# Patient Record
Sex: Male | Born: 1955 | Race: White | Hispanic: No | Marital: Married | Smoking: Never smoker
Health system: Southern US, Community
[De-identification: ages and names within clinical notes are randomized; demographics above are authoritative.]

---

## 2002-05-20 ENCOUNTER — Emergency Department (HOSPITAL_COMMUNITY): Admission: EM | Admit: 2002-05-20 | Discharge: 2002-05-20 | Payer: Self-pay | Admitting: Emergency Medicine

## 2008-05-08 ENCOUNTER — Emergency Department: Payer: Self-pay | Admitting: Emergency Medicine

## 2009-04-17 ENCOUNTER — Ambulatory Visit: Payer: Self-pay | Admitting: Gastroenterology

## 2010-11-25 ENCOUNTER — Ambulatory Visit: Payer: Self-pay | Admitting: Orthopaedic Surgery

## 2016-05-02 ENCOUNTER — Ambulatory Visit: Payer: Self-pay

## 2017-07-04 ENCOUNTER — Emergency Department: Payer: Self-pay | Admitting: Anesthesiology

## 2017-07-04 ENCOUNTER — Ambulatory Visit: Admit: 2017-07-04 | Payer: Self-pay | Admitting: Gastroenterology

## 2017-07-04 ENCOUNTER — Encounter: Payer: Self-pay | Admitting: Emergency Medicine

## 2017-07-04 ENCOUNTER — Emergency Department: Payer: Self-pay

## 2017-07-04 ENCOUNTER — Encounter
Admission: EM | Disposition: A | Discharge status: Home / Self Care | Payer: Self-pay | Source: Home / Self Care | Attending: Emergency Medicine

## 2017-07-04 ENCOUNTER — Emergency Department
Admission: EM | Admit: 2017-07-04 | Discharge: 2017-07-04 | Disposition: A | Discharge status: Home / Self Care | Payer: Self-pay | Attending: Emergency Medicine | Admitting: Emergency Medicine

## 2017-07-04 ENCOUNTER — Other Ambulatory Visit: Payer: Self-pay

## 2017-07-04 DIAGNOSIS — T18108A Unspecified foreign body in esophagus causing other injury, initial encounter: Secondary | ICD-10-CM

## 2017-07-04 DIAGNOSIS — R1310 Dysphagia, unspecified: Secondary | ICD-10-CM | POA: Insufficient documentation

## 2017-07-04 HISTORY — PX: ESOPHAGOGASTRODUODENOSCOPY (EGD) WITH PROPOFOL: SHX5813

## 2017-07-04 LAB — BASIC METABOLIC PANEL
Anion gap: 12 (ref 5–15)
BUN: 20 mg/dL (ref 6–20)
CO2: 23 mmol/L (ref 22–32)
CREATININE: 1.11 mg/dL (ref 0.61–1.24)
Calcium: 9.5 mg/dL (ref 8.9–10.3)
Chloride: 101 mmol/L (ref 98–111)
GFR calc Af Amer: >60 mL/min (ref >60)
Glucose, Bld: 108 mg/dL — ABNORMAL HIGH (ref 70–99)
Potassium: 3.8 mmol/L (ref 3.5–5.1)
SODIUM: 136 mmol/L (ref 135–145)

## 2017-07-04 LAB — CBC
HCT: 48.5 % (ref 40.0–52.0)
Hemoglobin: 16.3 g/dL (ref 13.0–18.0)
MCH: 30.6 pg (ref 26.0–34.0)
MCHC: 33.6 g/dL (ref 32.0–36.0)
MCV: 91.1 fL (ref 80.0–100.0)
PLATELETS: 205 10*3/uL (ref 150–440)
RBC: 5.33 MIL/uL (ref 4.40–5.90)
RDW: 14.2 % (ref 11.5–14.5)
WBC: 6.5 10*3/uL (ref 3.8–10.6)

## 2017-07-04 LAB — TROPONIN I: Troponin I: <0.03 ng/mL (ref <0.03)

## 2017-07-04 SURGERY — ESOPHAGOGASTRODUODENOSCOPY (EGD) WITH PROPOFOL
Anesthesia: General

## 2017-07-04 MED ORDER — ONDANSETRON HCL 4 MG/2ML IJ SOLN
INTRAMUSCULAR | Status: DC | PRN
  Administered 2017-07-04: 4 mg via INTRAVENOUS

## 2017-07-04 MED ORDER — LORAZEPAM 2 MG/ML IJ SOLN
1.0000 mg | Freq: Once | INTRAMUSCULAR | Status: AC
  Administered 2017-07-04: 1 mg via INTRAVENOUS
  Filled 2017-07-04: qty 1

## 2017-07-04 MED ORDER — PROPOFOL 10 MG/ML IV BOLUS
INTRAVENOUS | Status: DC | PRN
  Administered 2017-07-04: 200 mg via INTRAVENOUS

## 2017-07-04 MED ORDER — GI COCKTAIL ~~LOC~~
30.0000 mL | Freq: Once | ORAL | Status: AC
  Administered 2017-07-04: 30 mL via ORAL
  Filled 2017-07-04: qty 30

## 2017-07-04 MED ORDER — LIDOCAINE HCL (PF) 2 % IJ SOLN
INTRAMUSCULAR | Status: AC
  Filled 2017-07-04: qty 10

## 2017-07-04 MED ORDER — SUCCINYLCHOLINE CHLORIDE 20 MG/ML IJ SOLN
INTRAMUSCULAR | Status: DC | PRN
  Administered 2017-07-04: 140 mg via INTRAVENOUS

## 2017-07-04 MED ORDER — FENTANYL CITRATE (PF) 100 MCG/2ML IJ SOLN
INTRAMUSCULAR | Status: AC
  Filled 2017-07-04: qty 2

## 2017-07-04 MED ORDER — ONDANSETRON HCL 4 MG/2ML IJ SOLN
INTRAMUSCULAR | Status: AC
  Filled 2017-07-04: qty 2

## 2017-07-04 MED ORDER — LIDOCAINE HCL (CARDIAC) PF 100 MG/5ML IV SOSY
PREFILLED_SYRINGE | INTRAVENOUS | Status: DC | PRN
  Administered 2017-07-04: 100 mg via INTRAVENOUS

## 2017-07-04 MED ORDER — GLUCAGON HCL RDNA (DIAGNOSTIC) 1 MG IJ SOLR
INTRAMUSCULAR | Status: AC
  Administered 2017-07-04: 1 mg via INTRAVENOUS
  Filled 2017-07-04: qty 1

## 2017-07-04 MED ORDER — PROPOFOL 10 MG/ML IV BOLUS
INTRAVENOUS | Status: AC
  Filled 2017-07-04: qty 20

## 2017-07-04 MED ORDER — SODIUM CHLORIDE 0.9 % IV BOLUS
1000.0000 mL | Freq: Once | INTRAVENOUS | Status: AC
Start: 2017-07-04 — End: 2017-07-04
  Administered 2017-07-04: 1000 mL via INTRAVENOUS

## 2017-07-04 MED ORDER — FENTANYL CITRATE (PF) 100 MCG/2ML IJ SOLN
INTRAMUSCULAR | Status: DC | PRN
  Administered 2017-07-04: 50 ug via INTRAVENOUS

## 2017-07-04 MED ORDER — GLUCAGON HCL RDNA (DIAGNOSTIC) 1 MG IJ SOLR
1.0000 mg | Freq: Once | INTRAMUSCULAR | Status: AC
  Administered 2017-07-04: 1 mg via INTRAVENOUS

## 2017-07-04 MED ORDER — LACTATED RINGERS IV SOLN
INTRAVENOUS | Status: DC | PRN
  Administered 2017-07-04: 20:00:00 via INTRAVENOUS

## 2017-07-04 NOTE — ED Provider Notes (Signed)
[facility] Emergency Department Provider Note  ____________________________________________   I have reviewed the triage vital signs and the nursing notes.   HISTORY  Chief Complaint Gastroesophageal Reflux and Dysphagia   History limited by: Not Limited   HPI Wyatt Gallegos is a 62 y.o. male who presents to the emergency department today because of concern for difficulty with swallowing and acid reflux. The patient states he has a history of acid reflux. Missed his medication this morning. Was eating lunch, which was apparently somewhat spicy chicken, when he felt like he started having issues with his acid reflux. Since then has not been able to swallow his saliva. No significant pain but feels like his discomfort would be relieved if he could vomit. Denies ever having symptoms similar to this.    History reviewed. No pertinent past medical history.  There are no active problems to display for this patient.   History reviewed. No pertinent surgical history.  Prior to Admission medications   Not on File    Allergies Patient has no known allergies.  History reviewed. No pertinent family history.  Social History Social History   Tobacco Use  . Smoking status: Never Smoker  . Smokeless tobacco: Never Used  Substance Use Topics  . Alcohol use: Never    Frequency: Never  . Drug use: Never    Review of Systems Constitutional: No fever/chills Eyes: No visual changes. ENT: No sore throat. Cardiovascular: Denies chest pain. Respiratory: Denies shortness of breath. Gastrointestinal: Positive for difficulty with swallowing.  Genitourinary: Negative for dysuria. Musculoskeletal: Negative for back pain. Skin: Negative for rash. Neurological: Negative for headaches, focal weakness or numbness.  ____________________________________________   PHYSICAL EXAM:  VITAL SIGNS: ED Triage Vitals  Enc Vitals Group     BP 07/04/17 1541 (!) 152/85      Pulse Rate 07/04/17 1541 84     Resp 07/04/17 1541 20     Temp 07/04/17 1550 98 F (36.7 C)     Temp Source 07/04/17 1550 Axillary     SpO2 07/04/17 1541 100 %     Weight 07/04/17 1550 200 lb (90.7 kg)     Height 07/04/17 1550 5\' 9"  (1.753 m)     Head Circumference --      Peak Flow --      Pain Score 07/04/17 1542 8   Constitutional: Alert and oriented. Spitting out saliva. Appears uncomfortable.  Eyes: Conjunctivae are normal.  ENT      Head: Normocephalic and atraumatic.      Nose: No congestion/rhinnorhea.      Mouth/Throat: Mucous membranes are moist.      Neck: No stridor. Hematological/Lymphatic/Immunilogical: No cervical lymphadenopathy. Cardiovascular: Normal rate, regular rhythm.  No murmurs, rubs, or gallops.  Respiratory: Normal respiratory effort without tachypnea nor retractions. Breath sounds are clear and equal bilaterally. No wheezes/rales/rhonchi. Gastrointestinal: Soft and non tender. No rebound. No guarding.  Genitourinary: Deferred Musculoskeletal: Normal range of motion in all extremities. No lower extremity edema. Neurologic:  Normal speech and language. No gross focal neurologic deficits are appreciated.  Skin:  Skin is warm, dry and intact. No rash noted. Psychiatric: Mood and affect are normal. Speech and behavior are normal. Patient exhibits appropriate insight and judgment.  ____________________________________________    LABS (pertinent positives/negatives)  Trop <0.03 CBC wnl BMP wnl except glu 108  ____________________________________________   EKG  I, Phineas SemenGraydon Dantavious Snowball, attending physician, personally viewed and interpreted this EKG  EKG Time: 1525 Rate: 88 Rhythm: normal sinus  rhythm Axis: normal Intervals: qtc 450 QRS: narrow ST changes: no st elevation Impression: normal ekg   ____________________________________________    RADIOLOGY  CXR No acute  disease  ____________________________________________   PROCEDURES  Procedures  ____________________________________________   INITIAL IMPRESSION / ASSESSMENT AND PLAN / ED COURSE  Pertinent labs & imaging results that were available during my care of the patient were reviewed by me and considered in my medical decision making (see chart for details).   Patient presented to the emergency department today because of concerns for difficulty with swallowing and concern for acid reflux.  Exam it does sound more like the patient may have a food impaction.  He certainly not able to swallow or pass any liquids.  Work-up otherwise without any clear etiology.  Chest x-ray and blood work normal.  Did contact Dr. Servando Snare with GI for endoscopy.   ____________________________________________   FINAL CLINICAL IMPRESSION(S) / ED DIAGNOSES  Final diagnoses:  Difficulty swallowing, mixes medication with food     Note: This dictation was prepared with Dragon dictation. Any transcriptional errors that result from this process are unintentional     Phineas Semen, MD 07/04/17 2115

## 2017-07-04 NOTE — Op Note (Signed)
[facility] Gastroenterology Patient Name: Wyatt Gallegos Procedure Date: 07/04/2017 8:18 PM MRN: [id number] Account #: [id number] Date of Birth: May 10, 1955 Admit Type: Outpatient Age: 62 Room: [facility] ENDO ROOM 4 Gender: Male Note Status: Finalized Procedure:            Upper GI endoscopy Indications:          Foreign body in the esophagus Providers:            Midge Minium MD, MD Medicines:            General Anesthesia Complications:        No immediate complications. Procedure:            Pre-Anesthesia Assessment:                       - Prior to the procedure, a History and Physical was                        performed, and patient medications and allergies were                        reviewed. The patient's tolerance of previous                        anesthesia was also reviewed. The risks and benefits of                        the procedure and the sedation options and risks were                        discussed with the patient. All questions were                        answered, and informed consent was obtained. Prior                        Anticoagulants: The patient has taken no previous                        anticoagulant or antiplatelet agents. ASA Grade                        Assessment: II - A patient with mild systemic disease.                        After reviewing the risks and benefits, the patient was                        deemed in satisfactory condition to undergo the                        procedure.                       After obtaining informed consent, the endoscope was                        passed under direct vision. Throughout the procedure,                        the patient's blood  pressure, pulse, and oxygen                        saturations were monitored continuously. The Endoscope                        was introduced through the mouth, and advanced to the                        second part of duodenum. The upper GI endoscopy  was                        accomplished without difficulty. The patient tolerated                        the procedure well. Findings:      Food was found at the gastroesophageal junction. Removal of food was       accomplished.      The stomach was normal.      The examined duodenum was normal. Impression:           - Food at the gastroesophageal junction. Removal was                        successful.                       - Normal stomach.                       - Normal examined duodenum. Recommendation:       - Discharge patient to home.                       - Resume previous diet.                       - Continue present medications.                       - Repeat upper endoscopy in 4 weeks for surveillance. Procedure Code(s):    --- Professional ---                       [phone], Esophagogastroduodenoscopy, flexible, transoral;                        with removal of foreign body(s) Diagnosis Code(s):    --- Professional ---                       T18.108A, Unspecified foreign body in esophagus causing                        other injury, initial encounter CPT copyright 2017 American Medical Association. All rights reserved. The codes documented in this report are preliminary and upon coder review may  be revised to meet current compliance requirements. Midge Miniumarren Destany Severns MD, MD 07/04/2017 8:36:37 PM This report has been signed electronically. Number of Addenda: 0 Note Initiated On: 07/04/2017 8:18 PM      [facility]

## 2017-07-04 NOTE — ED Notes (Signed)
Pt is awake, alert and oriented talks in complete sentences, reports he feels something stuck in his throat, continues to spit, has emesis bag at bedside.

## 2017-07-04 NOTE — ED Triage Notes (Signed)
Pt to ED from home c/o acid reflux, trouble swallowing x3 hours.  States missed last two days of acid reflux medications.  Pt is drooling and spitting in triage intermittently.  Patient with clear lung sounds bilaterally, speaking in complete and coherent sentences, no obvious tonsillar swelling.

## 2017-07-04 NOTE — ED Notes (Signed)
Verbal orders to hold ordered Ativan at this time as patient is calm and is able to swallow at this time. EDP at bedside.

## 2017-07-04 NOTE — ED Notes (Signed)
Pt continues to spit secretions.

## 2017-07-04 NOTE — Anesthesia Post-op Follow-up Note (Signed)
Anesthesia QCDR form completed.        

## 2017-07-04 NOTE — Transfer of Care (Signed)
Immediate Anesthesia Transfer of Care Note  Patient: Wyatt AndaRobert L Gallegos  Procedure(s) Performed: ESOPHAGOGASTRODUODENOSCOPY (EGD) WITH PROPOFOL (N/A )  Patient Location: PACU  Anesthesia Type:General  Level of Consciousness: awake, alert  and oriented  Airway & Oxygen Therapy: Patient Spontanous Breathing and Patient connected to nasal cannula oxygen  Post-op Assessment: Report given to RN and Post -op Vital signs reviewed and stable  Post vital signs: Reviewed and stable  Last Vitals:  Vitals Value Taken Time  BP    Temp    Pulse 88 07/04/2017  8:47 PM  Resp 21 07/04/2017  8:47 PM  SpO2 94 % 07/04/2017  8:47 PM  Vitals shown include unvalidated device data.  Last Pain:  Vitals:   07/04/17 1945  TempSrc: Oral  PainSc: 0-No pain         Complications: No apparent anesthesia complications

## 2017-07-04 NOTE — Anesthesia Procedure Notes (Signed)
Procedure Name: Intubation Date/Time: 07/04/2017 8:29 PM Performed by: Clinton Sawyer, CRNA Pre-anesthesia Checklist: Patient identified, Emergency Drugs available, Patient being monitored, Suction available and Timeout performed Patient Re-evaluated:Patient Re-evaluated prior to induction Oxygen Delivery Method: Circle system utilized Preoxygenation: Pre-oxygenation with 100% oxygen Induction Type: IV induction, Rapid sequence and Cricoid Pressure applied Laryngoscope Size: Mac and 4 Grade View: Grade I Tube type: Oral Tube size: 7.0 mm Number of attempts: 1 Airway Equipment and Method: Stylet Placement Confirmation: ETT inserted through vocal cords under direct vision,  positive ETCO2,  CO2 detector and breath sounds checked- equal and bilateral Secured at: 22 cm Tube secured with: Tape Dental Injury: Teeth and Oropharynx as per pre-operative assessment

## 2017-07-04 NOTE — ED Notes (Signed)
Pt had one episode of emesis, states that he now feels relief and is able to swallow coke. Pt encouraged to continue to swallow secretions.

## 2017-07-04 NOTE — Anesthesia Postprocedure Evaluation (Signed)
Anesthesia Post Note  Patient: Wyatt AndaRobert L Gallegos  Procedure(s) Performed: ESOPHAGOGASTRODUODENOSCOPY (EGD) WITH PROPOFOL (N/A )  Patient location during evaluation: PACU Anesthesia Type: General Level of consciousness: awake and alert and oriented Pain management: pain level controlled Vital Signs Assessment: post-procedure vital signs reviewed and stable Respiratory status: spontaneous breathing Cardiovascular status: blood pressure returned to baseline Anesthetic complications: no     Last Vitals:  Vitals:   07/04/17 1945 07/04/17 2049  BP: (!) 150/95 123/90  Pulse: 65 93  Resp: 20 20  Temp: 36.9 C 36.9 C  SpO2: 98% 96%    Last Pain:  Vitals:   07/04/17 2049  TempSrc:   PainSc: 0-No pain                 Hagen Tidd

## 2017-07-04 NOTE — ED Notes (Signed)
ED Provider at bedside. 

## 2017-07-04 NOTE — ED Notes (Signed)
Pt had an episode of belching and small amount of emesis. States that he feels better following. Pt up to bathroom to urinate.

## 2017-07-04 NOTE — Discharge Instructions (Signed)

## 2017-07-04 NOTE — ED Notes (Signed)
Given report to Glade NurseKimberly, Rn, pt dressed in a hospital gown, belongings in pt's belongings bag

## 2017-07-04 NOTE — H&P (Signed)
Midge Minium, MD [facility] [address] [place], [place] [address] Phone:[phone] Fax : [phone]  Primary Care Physician:  Barbette Reichmann, MD Primary Gastroenterologist:  Dr. Servando Snare  Pre-Procedure History & Physical: HPI:  Wyatt Gallegos is a 62 y.o. male is here for an endoscopy.   History reviewed. No pertinent past medical history.  History reviewed. No pertinent surgical history.  Prior to Admission medications   Medication Sig Start Date End Date Taking? Authorizing Provider  buPROPion (WELLBUTRIN XL) 150 MG 24 hr tablet Take 150 mg by mouth daily. 06/26/17  Yes [provider]  buPROPion (WELLBUTRIN XL) 300 MG 24 hr tablet Take 300 mg by mouth daily. 06/26/17  Yes [provider]  meloxicam (MOBIC) 15 MG tablet Take 15 mg by mouth daily. 06/23/17  Yes [provider]  tamsulosin (FLOMAX) 0.4 MG CAPS capsule Take 0.4 mg by mouth daily. 06/04/17  Yes [provider]  VYVANSE 70 MG capsule Take 70 mg by mouth daily. 06/24/17  Yes [provider]    Allergies as of 07/04/2017  . (No Known Allergies)    History reviewed. No pertinent family history.  Social History   Socioeconomic History  . Marital status: Married    Spouse name: Not on file  . Number of children: Not on file  . Years of education: Not on file  . Highest education level: Not on file  Occupational History  . Not on file  Social Needs  . Financial resource strain: Not on file  . Food insecurity:    Worry: Not on file    Inability: Not on file  . Transportation needs:    Medical: Not on file    Non-medical: Not on file  Tobacco Use  . Smoking status: Never Smoker  . Smokeless tobacco: Never Used  Substance and Sexual Activity  . Alcohol use: Never    Frequency: Never  . Drug use: Never  . Sexual activity: Not on file  Lifestyle  . Physical activity:    Days per week: Not on file    Minutes per session: Not on file  . Stress: Not on  file  Relationships  . Social connections:    Talks on phone: Not on file    Gets together: Not on file    Attends religious service: Not on file    Active member of club or organization: Not on file    Attends meetings of clubs or organizations: Not on file    Relationship status: Not on file  . Intimate partner violence:    Fear of current or ex partner: Not on file    Emotionally abused: Not on file    Physically abused: Not on file    Forced sexual activity: Not on file  Other Topics Concern  . Not on file  Social History Narrative  . Not on file    Review of Systems: See HPI, otherwise negative ROS  Physical Exam: BP (!) 150/95 (BP Location: Left Arm)   Pulse 65   Temp 98.5 F (36.9 C) (Oral)   Resp 20   Ht 5\' 9"  (1.753 m)   Wt 200 lb (90.7 kg)   SpO2 98%   BMI 29.53 kg/m  General:   Alert,  pleasant and cooperative in NAD Head:  Normocephalic and atraumatic. Neck:  Supple; no masses or thyromegaly. Lungs:  Clear throughout to auscultation.    Heart:  Regular rate and rhythm. Abdomen:  Soft, nontender and nondistended. Normal  bowel sounds, without guarding, and without rebound.   Neurologic:  Alert and  oriented x4;  grossly normal neurologically.  Impression/Plan: Wyatt Gallegos is here for an endoscopy to be performed for food in esophagus  Risks, benefits, limitations, and alternatives regarding  endoscopy have been reviewed with the patient.  Questions have been answered.  All parties agreeable.   Midge Miniumarren Jesenya Bowditch, MD  07/04/2017, 8:38 PM

## 2017-07-04 NOTE — ED Notes (Signed)
Pt unable to swallow secretions. States that he feels heaviness in epigastric area.

## 2017-07-04 NOTE — ED Notes (Signed)
Pt to endoscopy.

## 2017-07-04 NOTE — Anesthesia Preprocedure Evaluation (Signed)
Anesthesia Evaluation  Patient identified by MRN, date of birth, ID band Patient awake    Reviewed: Allergy & Precautions, NPO status , Patient's Chart, lab work & pertinent test results  Airway Mallampati: II  TM Distance: >3 FB     Dental no notable dental hx.    Pulmonary neg pulmonary ROS,    Pulmonary exam normal        Cardiovascular negative cardio ROS Normal cardiovascular exam     Neuro/Psych negative neurological ROS  negative psych ROS   GI/Hepatic Neg liver ROS,   Endo/Other  negative endocrine ROS  Renal/GU negative Renal ROS  negative genitourinary   Musculoskeletal negative musculoskeletal ROS (+)   Abdominal Normal abdominal exam  (+)   Peds negative pediatric ROS (+)  Hematology negative hematology ROS (+)   Anesthesia Other Findings   Reproductive/Obstetrics                             Anesthesia Physical Anesthesia Plan  ASA: II and emergent  Anesthesia Plan: General   Post-op Pain Management:    Induction: Intravenous, Rapid sequence and Cricoid pressure planned  PONV Risk Score and Plan:   Airway Management Planned: Oral ETT  Additional Equipment:   Intra-op Plan:   Post-operative Plan: Extubation in OR  Informed Consent: I have reviewed the patients History and Physical, chart, labs and discussed the procedure including the risks, benefits and alternatives for the proposed anesthesia with the patient or authorized representative who has indicated his/her understanding and acceptance.   Dental advisory given  Plan Discussed with: CRNA and Surgeon  Anesthesia Plan Comments:         Anesthesia Quick Evaluation

## 2017-07-07 ENCOUNTER — Encounter: Payer: Self-pay | Admitting: Gastroenterology

## 2017-12-02 ENCOUNTER — Other Ambulatory Visit: Payer: Self-pay | Admitting: Psychiatry

## 2017-12-22 ENCOUNTER — Other Ambulatory Visit: Payer: Self-pay | Admitting: Psychiatry

## 2017-12-22 DIAGNOSIS — F900 Attention-deficit hyperactivity disorder, predominantly inattentive type: Secondary | ICD-10-CM

## 2017-12-22 MED ORDER — LISDEXAMFETAMINE DIMESYLATE 70 MG PO CAPS: 70.0000 mg | ORAL_CAPSULE | Freq: Every day | ORAL | 0 refills | Status: DC

## 2017-12-22 MED ORDER — VYVANSE 70 MG PO CAPS: 70.0000 mg | ORAL_CAPSULE | Freq: Every day | ORAL | 0 refills | Status: DC

## 2017-12-22 NOTE — Progress Notes (Signed)
3 refills vyvanse

## 2018-02-04 ENCOUNTER — Other Ambulatory Visit: Payer: Self-pay | Admitting: Psychiatry

## 2018-02-05 NOTE — Telephone Encounter (Signed)
Need to review paper chart  

## 2018-03-06 ENCOUNTER — Ambulatory Visit: Payer: Self-pay | Admitting: Psychiatry

## 2018-03-06 ENCOUNTER — Encounter: Payer: Self-pay | Admitting: Psychiatry

## 2018-03-06 ENCOUNTER — Ambulatory Visit (INDEPENDENT_AMBULATORY_CARE_PROVIDER_SITE_OTHER): Payer: BLUE CROSS/BLUE SHIELD | Admitting: Psychiatry

## 2018-03-06 VITALS — BP 157/99 | HR 81

## 2018-03-06 DIAGNOSIS — F411 Generalized anxiety disorder: Secondary | ICD-10-CM | POA: Diagnosis not present

## 2018-03-06 DIAGNOSIS — F4001 Agoraphobia with panic disorder: Secondary | ICD-10-CM | POA: Diagnosis not present

## 2018-03-06 DIAGNOSIS — F331 Major depressive disorder, recurrent, moderate: Secondary | ICD-10-CM | POA: Diagnosis not present

## 2018-03-06 DIAGNOSIS — F902 Attention-deficit hyperactivity disorder, combined type: Secondary | ICD-10-CM

## 2018-03-06 DIAGNOSIS — F900 Attention-deficit hyperactivity disorder, predominantly inattentive type: Secondary | ICD-10-CM

## 2018-03-06 MED ORDER — CLONAZEPAM 0.5 MG PO TABS: 0.5000 mg | ORAL_TABLET | Freq: Every day | ORAL | 0 refills | Status: DC

## 2018-03-06 MED ORDER — VYVANSE 70 MG PO CAPS: 70.0000 mg | ORAL_CAPSULE | Freq: Every day | ORAL | 0 refills | Status: DC

## 2018-03-06 MED ORDER — LISDEXAMFETAMINE DIMESYLATE 70 MG PO CAPS: 70.0000 mg | ORAL_CAPSULE | Freq: Every day | ORAL | 0 refills | Status: DC

## 2018-03-06 NOTE — Progress Notes (Signed)
Wyatt Gallegos [id number] 21-Jul-1955 63 y.o.  Subjective:   Patient ID:  Wyatt Gallegos is a 63 y.o. (DOB 06/16/1955) male.  Chief Complaint:  Chief Complaint  Patient presents with  . Follow-up    Medication Management  . Anxiety  . ADHD  last seen September  HPI Wyatt Gallegos presents to the office today for follow-up of depression, anxiety, and ADD HD.  He has been under my care for many years.  Some anxiety and sometimes lightheaded.  Mentioned to PCP 2 weeks ago.  BP up  Too.  Been more anxious for awhile, probably work-related.  Seeing counselor and exercise and helps with divorce care at church.  Feels tense around neck and shoulders.  Spacey and mild nausea.  Tension affects movement.  Very physical. Forgetful.  More fearful feelings.  EMA. With 5 hours sleep since losing B in October.  Was very depressing for a while and then got behind on quarterly goals end of the year.  2 weeks ago PCP stopped the Wellbutrin and started the Lexapro 20.  Not a lot of change so far.    Asks about Klonopin for sleep.  Friend gave him one and it took the edge off.  Past Psychiatric Medication Trials: Prozac, Zoloft, Concerta, Ritalin, Provigil, Buspar, Vyvanse 70, Wellbutrin 450, modafinil, Lexapro.   Review of Systems:  Review of Systems  Constitutional: Positive for fatigue.  Neurological: Negative for tremors and weakness.  Psychiatric/Behavioral: Negative for agitation, behavioral problems, confusion, decreased concentration, dysphoric mood, hallucinations, self-injury, sleep disturbance and suicidal ideas. The patient is nervous/anxious. The patient is not hyperactive.     Medications: I have reviewed the patient's current medications.  Current Outpatient Medications  Medication Sig Dispense Refill  . escitalopram (LEXAPRO) 20 MG tablet Take 20 mg by mouth daily.    Marland Kitchen lisdexamfetamine (VYVANSE) 70 MG capsule Take 1 capsule (70 mg total) by mouth daily. 30 capsule 0  .  lisdexamfetamine (VYVANSE) 70 MG capsule Take 1 capsule (70 mg total) by mouth daily. 30 capsule 0  . meloxicam (MOBIC) 15 MG tablet Take 15 mg by mouth daily.  0  . tadalafil (CIALIS) 5 MG tablet Take by mouth.    . tamsulosin (FLOMAX) 0.4 MG CAPS capsule Take 0.4 mg by mouth daily.  0  . testosterone cypionate (DEPOTESTOSTERONE CYPIONATE) 200 MG/ML injection INJECT INTO THE MUSCLE EVERY 14 DAYS    . VYVANSE 70 MG capsule Take 1 capsule (70 mg total) by mouth daily. 30 capsule 0   No current facility-administered medications for this visit.     Medication Side Effects: Fatigue  Allergies: No Known Allergies  History reviewed. No pertinent past medical history.  History reviewed. No pertinent family history.  Social History   Socioeconomic History  . Marital status: Married    Spouse name: Not on file  . Number of children: Not on file  . Years of education: Not on file  . Highest education level: Not on file  Occupational History  . Not on file  Social Needs  . Financial resource strain: Not on file  . Food insecurity:    Worry: Not on file    Inability: Not on file  . Transportation needs:    Medical: Not on file    Non-medical: Not on file  Tobacco Use  . Smoking status: Never Smoker  . Smokeless tobacco: Never Used  Substance and Sexual Activity  . Alcohol use: Never    Frequency: Never  .  Drug use: Never  . Sexual activity: Not on file  Lifestyle  . Physical activity:    Days per week: Not on file    Minutes per session: Not on file  . Stress: Not on file  Relationships  . Social connections:    Talks on phone: Not on file    Gets together: Not on file    Attends religious service: Not on file    Active member of club or organization: Not on file    Attends meetings of clubs or organizations: Not on file    Relationship status: Not on file  . Intimate partner violence:    Fear of current or ex partner: Not on file    Emotionally abused: Not on file     Physically abused: Not on file    Forced sexual activity: Not on file  Other Topics Concern  . Not on file  Social History Narrative  . Not on file    Past Medical History, Surgical history, Social history, and Family history were reviewed and updated as appropriate.   Please see review of systems for further details on the patient's review from today.   Objective:   Physical Exam:  BP (!) 157/99 (BP Location: Left Arm)   Pulse 81   Physical Exam Constitutional:      General: He is not in acute distress.    Appearance: He is well-developed.  Musculoskeletal:        General: No deformity.  Neurological:     Mental Status: He is alert and oriented to person, place, and time.     Motor: No tremor.     Coordination: Coordination normal.     Gait: Gait normal.  Psychiatric:        Attention and Perception: Attention normal. He is attentive. He does not perceive auditory hallucinations.        Mood and Affect: Mood is anxious. Mood is not depressed. Affect is not labile, blunt, angry or inappropriate.        Speech: Speech normal.        Behavior: Behavior normal. Behavior is not slowed.        Thought Content: Thought content normal. Thought content does not include homicidal or suicidal ideation.        Cognition and Memory: Cognition normal.        Judgment: Judgment normal.     Comments: Insight is fair and always struggled with self awareness and ambivalence. Mildly slowed. Talkative.     Lab Review:     Component Value Date/Time   NA 136 07/04/2017 1551   K 3.8 07/04/2017 1551   CL 101 07/04/2017 1551   CO2 23 07/04/2017 1551   GLUCOSE 108 (H) 07/04/2017 1551   BUN 20 07/04/2017 1551   CREATININE 1.11 07/04/2017 1551   CALCIUM 9.5 07/04/2017 1551   GFRNONAA >60 07/04/2017 1551   GFRAA >60 07/04/2017 1551       Component Value Date/Time   WBC 6.5 07/04/2017 1551   RBC 5.33 07/04/2017 1551   HGB 16.3 07/04/2017 1551   HCT 48.5 07/04/2017 1551   PLT  205 07/04/2017 1551   MCV 91.1 07/04/2017 1551   MCH 30.6 07/04/2017 1551   MCHC 33.6 07/04/2017 1551   RDW 14.2 07/04/2017 1551    No results found for: POCLITH, LITHIUM   No results found for: PHENYTOIN, PHENOBARB, VALPROATE, CBMZ   .res Assessment: Plan:    Generalized anxiety disorder  Panic disorder with  agoraphobia  Major depressive disorder, recurrent episode, moderate (HCC)  Attention deficit hyperactivity disorder (ADHD), combined type   Supportive therapy dealing with work stress.  Positive thinking.  Take advantage of faith resources.  Disc the differences between Wellbutrin and Lexapro and may lose some ADD benefits in the switch.  Anxiety not much better off the Wellbutrin therefore it was not likely causing much of the anxiety so option to add back in if needed for focus.  Cont Lexapro except change to the night to reduce SE fatigue.  May have to reduc the dosage if that doesn't improve.  Give Lexapro 20 more time.  Discussed potential benefits, risks, and side effects of stimulants with patient to include increased heart rate, palpitations, insomnia, increased anxiety, increased irritability, or decreased appetite.  Instructed patient to contact office if experiencing any significant tolerability issues.  We discussed the short-term risks associated with benzodiazepines including sedation and increased fall risk among others.  Discussed long-term side effect risk including dependence, potential withdrawal symptoms, and the potential eventual dose-related risk of dementia.  OK use prn Klonopin 0.5 hs.  Disc risk dependence.  FU 8 weeks.  Meredith Staggers, MD, DFAPA    Please see After Visit Summary for patient specific instructions.  No future appointments.  No orders of the defined types were placed in this encounter.     -------------------------------

## 2018-05-05 ENCOUNTER — Other Ambulatory Visit: Payer: Self-pay

## 2018-05-05 ENCOUNTER — Ambulatory Visit (INDEPENDENT_AMBULATORY_CARE_PROVIDER_SITE_OTHER): Payer: BLUE CROSS/BLUE SHIELD | Admitting: Psychiatry

## 2018-05-05 ENCOUNTER — Encounter: Payer: Self-pay | Admitting: Psychiatry

## 2018-05-05 DIAGNOSIS — F902 Attention-deficit hyperactivity disorder, combined type: Secondary | ICD-10-CM | POA: Diagnosis not present

## 2018-05-05 DIAGNOSIS — F3342 Major depressive disorder, recurrent, in full remission: Secondary | ICD-10-CM

## 2018-05-05 DIAGNOSIS — F900 Attention-deficit hyperactivity disorder, predominantly inattentive type: Secondary | ICD-10-CM

## 2018-05-05 DIAGNOSIS — F411 Generalized anxiety disorder: Secondary | ICD-10-CM

## 2018-05-05 DIAGNOSIS — F4001 Agoraphobia with panic disorder: Secondary | ICD-10-CM | POA: Diagnosis not present

## 2018-05-05 MED ORDER — LISDEXAMFETAMINE DIMESYLATE 70 MG PO CAPS: 70.0000 mg | ORAL_CAPSULE | Freq: Every day | ORAL | 0 refills | Status: DC

## 2018-05-05 MED ORDER — CLONAZEPAM 0.5 MG PO TABS: 0.5000 mg | ORAL_TABLET | Freq: Two times a day (BID) | ORAL | 3 refills | Status: DC

## 2018-05-05 NOTE — Progress Notes (Signed)
Wyatt Gallegos [id number] 11-06-1955 63 y.o.   Virtual Visit via Telephone Note  I connected with pt by telephone and verified that I am speaking with the correct person using two identifiers.   I discussed the limitations, risks, security and privacy concerns of performing an evaluation and management service by telephone and the availability of in person appointments. I also discussed with the patient that there may be a patient responsible charge related to this service. The patient expressed understanding and agreed to proceed.  I discussed the assessment and treatment plan with the patient. The patient was provided an opportunity to ask questions and all were answered. The patient agreed with the plan and demonstrated an understanding of the instructions.   The patient was advised to call back or seek an in-person evaluation if the symptoms worsen or if the condition fails to improve as anticipated.  I provided 15 minutes of non-face-to-face time during this encounter. The call started at noon and ended at 1215. The patient was located at work and the provider was located at office.   Subjective:   Patient ID:  Wyatt Gallegos is a 63 y.o. (DOB 07-Jun-1955) male.  Chief Complaint:  Chief Complaint  Patient presents with  . Follow-up    med management  . Anxiety  last seen September  Anxiety  Symptoms include nervous/anxious behavior. Patient reports no confusion, decreased concentration or suicidal ideas.     Wyatt Gallegos presents to the office today for follow-up of depression, anxiety, and ADD HD.  He has been under my care for many years.  Just got laid off with Covid.  Daily still had had anxiety but the Lexapro causes some fatigue.  Tension daily workdays.  Still dealing with it.  Use to feel calmer.  Not depressed.  Not panic.  Still doing counseling.  Exercising 3 times weekly.  Klonazepam really helped take the edge off anxiety during the day and could focus  better bc less anxiety.  Wants to take it twice daily.  Sleep well with Klonopin.  2 weeks ago PCP stopped the Wellbutrin and started the Lexapro 20.  Not a lot of change so far.    Asks about Klonopin for sleep.  Friend gave him one and it took the edge off.  Past Psychiatric Medication Trials: Prozac, Zoloft,Lexapro. Concerta, Ritalin, Provigil, Buspar, Vyvanse 70, Wellbutrin 450, modafinil,    Review of Systems:  Review of Systems  Constitutional: Positive for fatigue.  Neurological: Negative for tremors and weakness.  Psychiatric/Behavioral: Negative for agitation, behavioral problems, confusion, decreased concentration, dysphoric mood, hallucinations, self-injury, sleep disturbance and suicidal ideas. The patient is nervous/anxious. The patient is not hyperactive.     Medications: I have reviewed the patient's current medications.  Current Outpatient Medications  Medication Sig Dispense Refill  . clonazePAM (KLONOPIN) 0.5 MG tablet Take 1 tablet (0.5 mg total) by mouth at bedtime. 30 tablet 0  . escitalopram (LEXAPRO) 20 MG tablet Take 20 mg by mouth daily.    Marland Kitchen lisdexamfetamine (VYVANSE) 70 MG capsule Take 1 capsule (70 mg total) by mouth daily. 30 capsule 0  . lisdexamfetamine (VYVANSE) 70 MG capsule Take 1 capsule (70 mg total) by mouth daily. 30 capsule 0  . lisdexamfetamine (VYVANSE) 70 MG capsule Take 1 capsule (70 mg total) by mouth daily. 30 capsule 0  . meloxicam (MOBIC) 15 MG tablet Take 15 mg by mouth daily.  0  . tadalafil (CIALIS) 5 MG tablet Take by mouth.    Marland Kitchen  tamsulosin (FLOMAX) 0.4 MG CAPS capsule Take 0.4 mg by mouth daily.  0  . testosterone cypionate (DEPOTESTOSTERONE CYPIONATE) 200 MG/ML injection INJECT 1ML INTO THE MUSCLE EVERY 14 DAYS    . VYVANSE 70 MG capsule Take 1 capsule (70 mg total) by mouth daily. 30 capsule 0   No current facility-administered medications for this visit.     Medication Side Effects: Fatigue  Allergies: No Known  Allergies  No past medical history on file.  No family history on file.  Social History   Socioeconomic History  . Marital status: Married    Spouse name: Not on file  . Number of children: Not on file  . Years of education: Not on file  . Highest education level: Not on file  Occupational History  . Not on file  Social Needs  . Financial resource strain: Not on file  . Food insecurity:    Worry: Not on file    Inability: Not on file  . Transportation needs:    Medical: Not on file    Non-medical: Not on file  Tobacco Use  . Smoking status: Never Smoker  . Smokeless tobacco: Never Used  Substance and Sexual Activity  . Alcohol use: Never    Frequency: Never  . Drug use: Never  . Sexual activity: Not on file  Lifestyle  . Physical activity:    Days per week: Not on file    Minutes per session: Not on file  . Stress: Not on file  Relationships  . Social connections:    Talks on phone: Not on file    Gets together: Not on file    Attends religious service: Not on file    Active member of club or organization: Not on file    Attends meetings of clubs or organizations: Not on file    Relationship status: Not on file  . Intimate partner violence:    Fear of current or ex partner: Not on file    Emotionally abused: Not on file    Physically abused: Not on file    Forced sexual activity: Not on file  Other Topics Concern  . Not on file  Social History Narrative  . Not on file    Past Medical History, Surgical history, Social history, and Family history were reviewed and updated as appropriate.   Please see review of systems for further details on the patient's review from today.   Objective:   Physical Exam:  There were no vitals taken for this visit.  Physical Exam Neurological:     Mental Status: He is alert and oriented to person, place, and time.     Cranial Nerves: No dysarthria.  Psychiatric:        Attention and Perception: Attention normal.         Mood and Affect: Mood is anxious. Mood is not depressed.        Speech: Speech normal.        Behavior: Behavior is cooperative.        Thought Content: Thought content normal. Thought content is not paranoid or delusional. Thought content does not include homicidal or suicidal ideation. Thought content does not include homicidal or suicidal plan.        Cognition and Memory: Cognition and memory normal.        Judgment: Judgment normal.     Comments: Insight good.     Lab Review:     Component Value Date/Time   NA 136 07/04/2017  1551   K 3.8 07/04/2017 1551   CL 101 07/04/2017 1551   CO2 23 07/04/2017 1551   GLUCOSE 108 (H) 07/04/2017 1551   BUN 20 07/04/2017 1551   CREATININE 1.11 07/04/2017 1551   CALCIUM 9.5 07/04/2017 1551   GFRNONAA >60 07/04/2017 1551   GFRAA >60 07/04/2017 1551       Component Value Date/Time   WBC 6.5 07/04/2017 1551   RBC 5.33 07/04/2017 1551   HGB 16.3 07/04/2017 1551   HCT 48.5 07/04/2017 1551   PLT 205 07/04/2017 1551   MCV 91.1 07/04/2017 1551   MCH 30.6 07/04/2017 1551   MCHC 33.6 07/04/2017 1551   RDW 14.2 07/04/2017 1551    No results found for: POCLITH, LITHIUM   No results found for: PHENYTOIN, PHENOBARB, VALPROATE, CBMZ   .res Assessment: Plan:    Generalized anxiety disorder  Panic disorder with agoraphobia  Attention deficit hyperactivity disorder (ADHD), combined type  Recurrent major depression in full remission (HCC)   Supportive therapy dealing with work stress.  Positive thinking.  Take advantage of faith resources.   Anxiety not much better off the Wellbutrin therefore it was not likely causing much of the anxiety so option to add back in if needed for focus.  Still anxious but not depressed.  Too much fatigue with Lexapro.  His anxiety has been difficult to control with standard SSRIs.  He asked to try clonazepam twice a day.  It is not ideal to use benzodiazepines plus stimulants together but given his treatment  resistant anxiety and his ability to get by usually with 0.25 mg Klonopin in the morning it is a reasonable accommodation.  We will not want to see dose escalation with the Klonopin.  Cont Lexapro except change to the night to reduce SE fatigue.  May have to reduc the dosage if that doesn't improve.  Reduce Lexapro 10 to eliminate fatigue.  If anxiety picks up then try duloxetine.  Which should have less fatigue than the Lexapro.  Discussed potential benefits, risks, and side effects of stimulants with patient to include increased heart rate, palpitations, insomnia, increased anxiety, increased irritability, or decreased appetite.  Instructed patient to contact office if experiencing any significant tolerability issues.  We discussed the short-term risks associated with benzodiazepines including sedation and increased fall risk among others.  Discussed long-term side effect risk including dependence, potential withdrawal symptoms, and the potential eventual dose-related risk of dementia.  OK use prn Klonopin 0.5 hs and ok 1/2 to 1 tablet in the morning.  Disc risk dependence.  FU 2 mos  Meredith Staggers, MD, DFAPA    Please see After Visit Summary for patient specific instructions.  No future appointments.  No orders of the defined types were placed in this encounter.     -------------------------------

## 2018-05-20 ENCOUNTER — Other Ambulatory Visit: Payer: Self-pay | Admitting: Psychiatry

## 2018-05-20 ENCOUNTER — Telehealth: Payer: Self-pay | Admitting: Psychiatry

## 2018-05-20 MED ORDER — DULOXETINE HCL 20 MG PO CPEP: ORAL_CAPSULE | ORAL | 1 refills | Status: DC

## 2018-05-20 NOTE — Telephone Encounter (Signed)
He has clonazepam refills at the pharmacy.  As discussed at the last visit if the reduction in Lexapro did not help with his sleepiness and grogginess we would switch him to duloxetine.  I sent in prescription for duloxetine 20 mg capsules 1 daily for 1 week and then 2 daily.  He should immediately stop the Lexapro as soon as he starts duloxetine.

## 2018-05-20 NOTE — Telephone Encounter (Signed)
Pt called to advise the new med Lexapro makes him very sleepy and groggy. He only takes 1/2 at a time. Was taking Wellbutrin And switched to Lexapro. Clonazepam works well for anxiety. Only takes 1/2 and it works well. [phone]    [facility] [employer]

## 2018-05-21 NOTE — Telephone Encounter (Signed)
Left detailed information and to call back with any questions or concerns 

## 2018-06-30 ENCOUNTER — Encounter: Payer: Self-pay | Admitting: Psychiatry

## 2018-06-30 ENCOUNTER — Ambulatory Visit (INDEPENDENT_AMBULATORY_CARE_PROVIDER_SITE_OTHER): Payer: BC Managed Care – PPO | Admitting: Psychiatry

## 2018-06-30 ENCOUNTER — Other Ambulatory Visit: Payer: Self-pay

## 2018-06-30 DIAGNOSIS — F411 Generalized anxiety disorder: Secondary | ICD-10-CM | POA: Diagnosis not present

## 2018-06-30 DIAGNOSIS — F5105 Insomnia due to other mental disorder: Secondary | ICD-10-CM

## 2018-06-30 DIAGNOSIS — F3341 Major depressive disorder, recurrent, in partial remission: Secondary | ICD-10-CM | POA: Diagnosis not present

## 2018-06-30 DIAGNOSIS — F4001 Agoraphobia with panic disorder: Secondary | ICD-10-CM

## 2018-06-30 DIAGNOSIS — F900 Attention-deficit hyperactivity disorder, predominantly inattentive type: Secondary | ICD-10-CM

## 2018-06-30 MED ORDER — LISDEXAMFETAMINE DIMESYLATE 70 MG PO CAPS: 70.0000 mg | ORAL_CAPSULE | Freq: Every day | ORAL | 0 refills | Status: DC

## 2018-06-30 MED ORDER — DULOXETINE HCL 20 MG PO CPEP: ORAL_CAPSULE | ORAL | 0 refills | Status: DC

## 2018-06-30 MED ORDER — CLONAZEPAM 0.5 MG PO TABS: 0.5000 mg | ORAL_TABLET | Freq: Two times a day (BID) | ORAL | 0 refills | Status: DC

## 2018-06-30 MED ORDER — VYVANSE 70 MG PO CAPS: 70.0000 mg | ORAL_CAPSULE | Freq: Every day | ORAL | 0 refills | Status: DC

## 2018-06-30 NOTE — Progress Notes (Signed)
Wyatt Gallegos [id number] 03-31-1955 63 y.o.   Virtual Visit via Telephone Note  I connected with pt by telephone and verified that I am speaking with the correct person using two identifiers.   I discussed the limitations, risks, security and privacy concerns of performing an evaluation and management service by telephone and the availability of in person appointments. I also discussed with the patient that there may be a patient responsible charge related to this service. The patient expressed understanding and agreed to proceed.  I discussed the assessment and treatment plan with the patient. The patient was provided an opportunity to ask questions and all were answered. The patient agreed with the plan and demonstrated an understanding of the instructions.   The patient was advised to call back or seek an in-person evaluation if the symptoms worsen or if the condition fails to improve as anticipated.  I provided 36minutes of non-face-to-face time during this encounter. The call started at 10 AM and ended at 1030. The patient was located at work and the provider was located at office.   Subjective:   Patient ID:  Wyatt Gallegos is a 63 y.o. (DOB 19-Sep-1955) male.  Chief Complaint:  Chief Complaint  Patient presents with  . Medication Problem    Lexapro fatigue, med management  . Anxiety    sometimes with exercise  . Follow-up    change to duloxetine  last seen September  Anxiety Symptoms include nervous/anxious behavior. Patient reports no confusion, decreased concentration or suicidal ideas.     Georgiann Mccoy presents to the office today for follow-up of depression, anxiety, and ADD HD.  He has been under my care for many years.  Last visit May and was fatigue and we reduced Lexapro to help.  It did not help so we switched to duloxetine and it helped when he called back May 13.  Much more balanced out now and feels better.  Energy is better and no longer drowsy as with  Lexapro.  More alert and mindful.  Doing well physically.  Playing a lot of tennis.  Feels stronger.  Losing weight.Anxiety is better overall.  Using clonazepam usually about 8:30.  Sleeping well.  Just got laid off with Covid.  New job and feels better.  Still doing counseling.  Exercising 3 times weekly.  Klonazepam really helped take the edge off anxiety during the day and could focus better bc less anxiety.  Wants to take it twice daily.  Sleep well with Klonopin.  2 weeks ago PCP stopped the Wellbutrin and started the Lexapro 20.  Not a lot of change so far.    Clonazepam has helped anxiety and sleep.  He is functioning better as a result.  Still going to counseling.  Denies her alcohol or caffeine excess.  Past Psychiatric Medication Trials: Prozac, Zoloft,Lexapro fatigue. Concerta, Ritalin, Provigil, Buspar, Vyvanse 70, Wellbutrin 450, modafinil, Duloxetine    Review of Systems:  Review of Systems  Constitutional: Negative for fatigue.  Neurological: Negative for tremors and weakness.  Psychiatric/Behavioral: Negative for agitation, behavioral problems, confusion, decreased concentration, dysphoric mood, hallucinations, self-injury, sleep disturbance and suicidal ideas. The patient is nervous/anxious. The patient is not hyperactive.     Medications: I have reviewed the patient's current medications.  Current Outpatient Medications  Medication Sig Dispense Refill  . clonazePAM (KLONOPIN) 0.5 MG tablet Take 1 tablet (0.5 mg total) by mouth 2 (two) times daily. 180 tablet 0  . DULoxetine (CYMBALTA) 20 MG capsule 2 daily 180  capsule 0  . lisdexamfetamine (VYVANSE) 70 MG capsule Take 1 capsule (70 mg total) by mouth daily. 30 capsule 0  . lisdexamfetamine (VYVANSE) 70 MG capsule Take 1 capsule (70 mg total) by mouth daily. 30 capsule 0  . [START ON 07/28/2018] lisdexamfetamine (VYVANSE) 70 MG capsule Take 1 capsule (70 mg total) by mouth daily. 30 capsule 0  . meloxicam (MOBIC)  15 MG tablet Take 15 mg by mouth daily.  0  . tadalafil (CIALIS) 5 MG tablet Take by mouth.    . tamsulosin (FLOMAX) 0.4 MG CAPS capsule Take 0.4 mg by mouth daily.  0  . testosterone cypionate (DEPOTESTOSTERONE CYPIONATE) 200 MG/ML injection INJECT 1ML INTO THE MUSCLE EVERY 14 DAYS    . metoprolol tartrate (LOPRESSOR) 25 MG tablet Take 25 mg by mouth daily.    Wyatt Gallegos. [START ON 08/25/2018] VYVANSE 70 MG capsule Take 1 capsule (70 mg total) by mouth daily. 30 capsule 0   No current facility-administered medications for this visit.     Medication Side Effects: Fatigue  Allergies: No Known Allergies  History reviewed. No pertinent past medical history.  History reviewed. No pertinent family history.  Social History   Socioeconomic History  . Marital status: Married    Spouse name: Not on file  . Number of children: Not on file  . Years of education: Not on file  . Highest education level: Not on file  Occupational History  . Not on file  Social Needs  . Financial resource strain: Not on file  . Food insecurity    Worry: Not on file    Inability: Not on file  . Transportation needs    Medical: Not on file    Non-medical: Not on file  Tobacco Use  . Smoking status: Never Smoker  . Smokeless tobacco: Never Used  Substance and Sexual Activity  . Alcohol use: Never    Frequency: Never  . Drug use: Never  . Sexual activity: Not on file  Lifestyle  . Physical activity    Days per week: Not on file    Minutes per session: Not on file  . Stress: Not on file  Relationships  . Social Musicianconnections    Talks on phone: Not on file    Gets together: Not on file    Attends religious service: Not on file    Active member of club or organization: Not on file    Attends meetings of clubs or organizations: Not on file    Relationship status: Not on file  . Intimate partner violence    Fear of current or ex partner: Not on file    Emotionally abused: Not on file    Physically abused: Not  on file    Forced sexual activity: Not on file  Other Topics Concern  . Not on file  Social History Narrative  . Not on file    Past Medical History, Surgical history, Social history, and Family history were reviewed and updated as appropriate.   Please see review of systems for further details on the patient's review from today.   Objective:   Physical Exam:  There were no vitals taken for this visit.  Physical Exam Neurological:     Mental Status: He is alert and oriented to person, place, and time.     Cranial Nerves: No dysarthria.  Psychiatric:        Attention and Perception: He is inattentive. He does not perceive auditory or visual hallucinations.  Mood and Affect: Mood is not anxious or depressed.        Speech: Speech normal.        Behavior: Behavior is cooperative.        Thought Content: Thought content normal. Thought content is not paranoid or delusional. Thought content does not include homicidal or suicidal ideation. Thought content does not include homicidal or suicidal plan.        Cognition and Memory: Cognition and memory normal.        Judgment: Judgment normal.     Comments: Insight good. A little forgetful about med compliance.     Lab Review:     Component Value Date/Time   NA 136 07/04/2017 1551   K 3.8 07/04/2017 1551   CL 101 07/04/2017 1551   CO2 23 07/04/2017 1551   GLUCOSE 108 (H) 07/04/2017 1551   BUN 20 07/04/2017 1551   CREATININE 1.11 07/04/2017 1551   CALCIUM 9.5 07/04/2017 1551   GFRNONAA >60 07/04/2017 1551   GFRAA >60 07/04/2017 1551       Component Value Date/Time   WBC 6.5 07/04/2017 1551   RBC 5.33 07/04/2017 1551   HGB 16.3 07/04/2017 1551   HCT 48.5 07/04/2017 1551   PLT 205 07/04/2017 1551   MCV 91.1 07/04/2017 1551   MCH 30.6 07/04/2017 1551   MCHC 33.6 07/04/2017 1551   RDW 14.2 07/04/2017 1551    No results found for: POCLITH, LITHIUM   No results found for: PHENYTOIN, PHENOBARB, VALPROATE, CBMZ    .res Assessment: Plan:   Molly MaduroRobert was seen today for medication problem, anxiety and follow-up.  Diagnoses and all orders for this visit:  Depression, major, recurrent, in partial remission (HCC) -     DULoxetine (CYMBALTA) 20 MG capsule; 2 daily  Generalized anxiety disorder -     DULoxetine (CYMBALTA) 20 MG capsule; 2 daily  Panic disorder with agoraphobia -     DULoxetine (CYMBALTA) 20 MG capsule; 2 daily -     clonazePAM (KLONOPIN) 0.5 MG tablet; Take 1 tablet (0.5 mg total) by mouth 2 (two) times daily.  Insomnia due to mental condition  Attention deficit hyperactivity disorder (ADHD), predominantly inattentive type -     lisdexamfetamine (VYVANSE) 70 MG capsule; Take 1 capsule (70 mg total) by mouth daily. -     lisdexamfetamine (VYVANSE) 70 MG capsule; Take 1 capsule (70 mg total) by mouth daily. -     VYVANSE 70 MG capsule; Take 1 capsule (70 mg total) by mouth daily.     Supportive therapy dealing with work stress.  Positive thinking.  Take advantage of faith resources.  Better off Wellbutrin and Lexapro was causing too much fatigue.  That is better off that medicine and on duloxetine.  So far his mood and anxiety are better as well.  He did not follow the instructions about increasing duloxetine as quickly as was recommended therefore he is at risk of some relapse in symptoms over the next 2 to 3 weeks.  He was cautioned about that and it should resolve on its own although we may need to increase duloxetine on the way to 60 mg  Less anxiety and fatigue so far with duloxetine. But only been on 40 mg for a week.  Disc risk worsening before he gets better. Consider increase duloxetine.  Call if this happens.  Discussed potential benefits, risks, and side effects of stimulants with patient to include increased heart rate, palpitations, insomnia, increased anxiety, increased irritability, or decreased  appetite.  Instructed patient to contact office if experiencing any  significant tolerability issues.  We discussed the short-term risks associated with benzodiazepines including sedation and increased fall risk among others.  Discussed long-term side effect risk including dependence, potential withdrawal symptoms, and the potential eventual dose-related risk of dementia.  OK use prn Klonopin 0.5 hs and ok 1/2 to 1 tablet in the morning.  Disc risk dependence.  FU3 mos  Meredith Staggersarey Cottle, MD, DFAPA    Please see After Visit Summary for patient specific instructions.  No future appointments.  No orders of the defined types were placed in this encounter.     -------------------------------

## 2018-09-27 ENCOUNTER — Other Ambulatory Visit: Payer: Self-pay | Admitting: Psychiatry

## 2018-09-27 DIAGNOSIS — F4001 Agoraphobia with panic disorder: Secondary | ICD-10-CM

## 2018-09-27 DIAGNOSIS — F411 Generalized anxiety disorder: Secondary | ICD-10-CM

## 2018-09-27 DIAGNOSIS — F3341 Major depressive disorder, recurrent, in partial remission: Secondary | ICD-10-CM

## 2018-10-20 ENCOUNTER — Encounter: Payer: Self-pay | Admitting: Psychiatry

## 2018-10-20 ENCOUNTER — Other Ambulatory Visit: Payer: Self-pay

## 2018-10-20 ENCOUNTER — Ambulatory Visit (INDEPENDENT_AMBULATORY_CARE_PROVIDER_SITE_OTHER): Payer: BC Managed Care – PPO | Admitting: Psychiatry

## 2018-10-20 DIAGNOSIS — F900 Attention-deficit hyperactivity disorder, predominantly inattentive type: Secondary | ICD-10-CM

## 2018-10-20 DIAGNOSIS — F411 Generalized anxiety disorder: Secondary | ICD-10-CM

## 2018-10-20 DIAGNOSIS — F4001 Agoraphobia with panic disorder: Secondary | ICD-10-CM

## 2018-10-20 DIAGNOSIS — F5105 Insomnia due to other mental disorder: Secondary | ICD-10-CM

## 2018-10-20 DIAGNOSIS — F3341 Major depressive disorder, recurrent, in partial remission: Secondary | ICD-10-CM

## 2018-10-20 MED ORDER — CLONAZEPAM 0.5 MG PO TABS: ORAL_TABLET | ORAL | 0 refills | Status: DC

## 2018-10-20 MED ORDER — DULOXETINE HCL 20 MG PO CPEP: 40.0000 mg | ORAL_CAPSULE | Freq: Every day | ORAL | 0 refills | Status: DC

## 2018-10-20 MED ORDER — LISDEXAMFETAMINE DIMESYLATE 70 MG PO CAPS: 70.0000 mg | ORAL_CAPSULE | Freq: Every day | ORAL | 0 refills | Status: DC

## 2018-10-20 NOTE — Progress Notes (Signed)
Wyatt AndaRobert L Castanon [id number] May 04, 1955 63 y.o.   Virtual Visit via Telephone Note  I connected with pt by telephone and verified that I am speaking with the correct person using two identifiers.   I discussed the limitations, risks, security and privacy concerns of performing an evaluation and management service by telephone and the availability of in person appointments. I also discussed with the patient that there may be a patient responsible charge related to this service. The patient expressed understanding and agreed to proceed.  I discussed the assessment and treatment plan with the patient. The patient was provided an opportunity to ask questions and all were answered. The patient agreed with the plan and demonstrated an understanding of the instructions.   The patient was advised to call back or seek an in-person evaluation if the symptoms worsen or if the condition fails to improve as anticipated.  I provided 30minutes of non-face-to-face time during this encounter. The call started at 9 AM and ended at 930. The patient was located at work and the provider was located at office.   Subjective:   Patient ID:  Wyatt Gallegos is a 63 y.o. (DOB May 04, 1955) male.  Chief Complaint:  Chief Complaint  Patient presents with  . Follow-up    Medication Management  . Anxiety    Medication Management  . Depression    Medication Management  last seen September  Anxiety Symptoms include nervous/anxious behavior. Patient reports no confusion, decreased concentration or suicidal ideas.     Wyatt Andaobert L Demps presents to the office today for follow-up of depression, anxiety, and ADD HD.  He has been under my care for many years.  At visit May and was fatigue and we reduced Lexapro to help.  It did not help so we switched to duloxetine  40 and it helped.  He has some chronic anxiety and was taking clonazepam at night and half of a clonazepam tablet in the morning for sleep and anxiety  respectively  Still pleased with duloxetine and feels like his normal self.  Patient reports stable mood and denies depressed or irritable moods.  Patient denies any recent difficulty with anxiety.  Patient denies difficulty with sleep initiation or maintenance. Denies appetite disturbance.  Patient reports that energy and motivation have been good.  Patient denies any difficulty with concentration.  Patient denies any suicidal ideation.  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    Still doing counseling.  Exercising 3 times weekly.  Klonazepam really helped take the edge off anxiety during the day and could focus better bc less anxiety.  Taking 0.5 mg AM and 1.0 mg HS.  Keeps anxiety from paralyzing him.  Getting sleep.  Wants to take it twice daily.  Sleep well with Klonopin.  Still benefit with Vyvanse for focus.  Still going to counseling.  Denies her alcohol or caffeine excess.  Past Psychiatric Medication Trials: Prozac, Zoloft,Lexapro fatigue. Concerta, Ritalin, Provigil, Buspar, Vyvanse 70, Wellbutrin 450, modafinil, Duloxetine 40,    Review of Systems:  Review of Systems  Constitutional: Negative for fatigue.  Neurological: Negative for tremors and weakness.  Psychiatric/Behavioral: Negative for agitation, behavioral problems, confusion, decreased concentration, dysphoric mood, hallucinations, self-injury, sleep disturbance and suicidal ideas. The patient is nervous/anxious. The patient is not hyperactive.     Medications: I have reviewed the patient's current medications.  Current Outpatient Medications  Medication Sig Dispense Refill  . clonazePAM (KLONOPIN) 0.5 MG tablet Take 1 tablet (0.5 mg total) by mouth  2 (two) times daily. 180 tablet 0  . DULoxetine (CYMBALTA) 20 MG capsule TAKE TWO CAPSULES BY MOUTH DAILY 180 capsule 0  . lisdexamfetamine (VYVANSE) 70 MG capsule Take 1 capsule (70 mg total) by mouth daily. 30 capsule 0  . lisdexamfetamine  (VYVANSE) 70 MG capsule Take 1 capsule (70 mg total) by mouth daily. 30 capsule 0  . lisdexamfetamine (VYVANSE) 70 MG capsule Take 1 capsule (70 mg total) by mouth daily. 30 capsule 0  . meloxicam (MOBIC) 15 MG tablet Take 15 mg by mouth daily.  0  . metoprolol tartrate (LOPRESSOR) 25 MG tablet Take 25 mg by mouth daily.    . tadalafil (CIALIS) 5 MG tablet Take by mouth.    . tamsulosin (FLOMAX) 0.4 MG CAPS capsule Take 0.4 mg by mouth daily.  0  . testosterone cypionate (DEPOTESTOSTERONE CYPIONATE) 200 MG/ML injection INJECT INTO THE MUSCLE EVERY 14 DAYS    . VYVANSE 70 MG capsule Take 1 capsule (70 mg total) by mouth daily. 30 capsule 0   No current facility-administered medications for this visit.     Medication Side Effects: Fatigue  Allergies: No Known Allergies  History reviewed. No pertinent past medical history.  History reviewed. No pertinent family history.  Social History   Socioeconomic History  . Marital status: Married    Spouse name: Not on file  . Number of children: Not on file  . Years of education: Not on file  . Highest education level: Not on file  Occupational History  . Not on file  Social Needs  . Financial resource strain: Not on file  . Food insecurity    Worry: Not on file    Inability: Not on file  . Transportation needs    Medical: Not on file    Non-medical: Not on file  Tobacco Use  . Smoking status: Never Smoker  . Smokeless tobacco: Never Used  Substance and Sexual Activity  . Alcohol use: Never    Frequency: Never  . Drug use: Never  . Sexual activity: Not on file  Lifestyle  . Physical activity    Days per week: Not on file    Minutes per session: Not on file  . Stress: Not on file  Relationships  . Social Musician on phone: Not on file    Gets together: Not on file    Attends religious service: Not on file    Active member of club or organization: Not on file    Attends meetings of clubs or organizations: Not  on file    Relationship status: Not on file  . Intimate partner violence    Fear of current or ex partner: Not on file    Emotionally abused: Not on file    Physically abused: Not on file    Forced sexual activity: Not on file  Other Topics Concern  . Not on file  Social History Narrative  . Not on file    Past Medical History, Surgical history, Social history, and Family history were reviewed and updated as appropriate.   Please see review of systems for further details on the patient's review from today.   Objective:   Physical Exam:  There were no vitals taken for this visit.  Physical Exam Neurological:     Mental Status: He is alert and oriented to person, place, and time.     Cranial Nerves: No dysarthria.  Psychiatric:        Attention and  Perception: He is inattentive. He does not perceive auditory or visual hallucinations.        Mood and Affect: Mood is not anxious or depressed.        Speech: Speech normal.        Behavior: Behavior is cooperative.        Thought Content: Thought content normal. Thought content is not paranoid or delusional. Thought content does not include homicidal or suicidal ideation. Thought content does not include homicidal or suicidal plan.        Cognition and Memory: Cognition and memory normal.        Judgment: Judgment normal.     Comments: Insight good. A little forgetful about med compliance.     Lab Review:     Component Value Date/Time   NA 136 07/04/2017 1551   K 3.8 07/04/2017 1551   CL 101 07/04/2017 1551   CO2 23 07/04/2017 1551   GLUCOSE 108 (H) 07/04/2017 1551   BUN 20 07/04/2017 1551   CREATININE 1.11 07/04/2017 1551   CALCIUM 9.5 07/04/2017 1551   GFRNONAA >60 07/04/2017 1551   GFRAA >60 07/04/2017 1551       Component Value Date/Time   WBC 6.5 07/04/2017 1551   RBC 5.33 07/04/2017 1551   HGB 16.3 07/04/2017 1551   HCT 48.5 07/04/2017 1551   PLT 205 07/04/2017 1551   MCV 91.1 07/04/2017 1551   MCH 30.6  07/04/2017 1551   MCHC 33.6 07/04/2017 1551   RDW 14.2 07/04/2017 1551    No results found for: POCLITH, LITHIUM   No results found for: PHENYTOIN, PHENOBARB, VALPROATE, CBMZ   .res Assessment: Plan:   Jatniel was seen today for follow-up, anxiety and depression.  Diagnoses and all orders for this visit:  Depression, major, recurrent, in partial remission ([facility])  Generalized anxiety disorder  Panic disorder with agoraphobia  Attention deficit hyperactivity disorder (ADHD), predominantly inattentive type  Insomnia due to mental condition   Supportive therapy dealing with work stress.  Positive thinking.  Take advantage of faith resources.  Still mains good maintains good response with regard to depression and anxiety with the switch from Lexapro and Wellbutrin to duloxetine 40 mg daily. Less anxiety and fatigue with duloxetine 40 mg. .  However he still needs the clonazepam as noted 0.5 mg tablets 1 every morning and 2 nightly for sleep and anxiety.  He is not ideal that he is taking clonazepam in the morning along with Vyvanse but his anxiety has been debilitating at times and has interfered with his ability to function and work settings and maintain employment.  Discussed potential benefits, risks, and side effects of stimulants with patient to include increased heart rate, palpitations, insomnia, increased anxiety, increased irritability, or decreased appetite.  Instructed patient to contact office if experiencing any significant tolerability issues.  We discussed the short-term risks associated with benzodiazepines including sedation and increased fall risk among others.  Discussed long-term side effect risk including dependence, potential withdrawal symptoms, and the potential eventual dose-related risk of dementia.   Disc risk dependence.  No med changes.  FU 4 mos  Lynder Parents, MD, DFAPA    Please see After Visit Summary for patient specific instructions.  No  future appointments.  No orders of the defined types were placed in this encounter.     -------------------------------

## 2018-12-14 ENCOUNTER — Telehealth: Payer: Self-pay | Admitting: Psychiatry

## 2018-12-14 NOTE — Telephone Encounter (Signed)
Refill is already on file to fill tomorrow.

## 2018-12-14 NOTE — Telephone Encounter (Signed)
Pt would like a refill on Vyvanse to be sent to [facility] on [address] in [place].

## 2018-12-24 ENCOUNTER — Telehealth: Payer: Self-pay | Admitting: Psychiatry

## 2018-12-24 ENCOUNTER — Other Ambulatory Visit: Payer: Self-pay | Admitting: Psychiatry

## 2018-12-24 DIAGNOSIS — F4001 Agoraphobia with panic disorder: Secondary | ICD-10-CM

## 2018-12-24 MED ORDER — CLONAZEPAM 0.5 MG PO TABS: ORAL_TABLET | ORAL | 1 refills | Status: DC

## 2018-12-24 NOTE — Telephone Encounter (Signed)
Pt requesting a refill on his Klonopin. He stated pharmacy ask that the directions be written so he will have enough to sustain him. He stated he may take an extra if he becomes overwhelmed due to his 18hr shifts, and sometimes he loses some because he keeps an extra in his pocket while working. Fill at the [facility] [address]

## 2019-01-15 ENCOUNTER — Other Ambulatory Visit: Payer: Self-pay

## 2019-01-15 ENCOUNTER — Telehealth: Payer: Self-pay | Admitting: Psychiatry

## 2019-01-15 DIAGNOSIS — F900 Attention-deficit hyperactivity disorder, predominantly inattentive type: Secondary | ICD-10-CM

## 2019-01-15 MED ORDER — LISDEXAMFETAMINE DIMESYLATE 70 MG PO CAPS: 70.0000 mg | ORAL_CAPSULE | Freq: Every day | ORAL | 0 refills | Status: DC

## 2019-01-15 NOTE — Telephone Encounter (Signed)
Last refill 12/19/2018 Last apt 10/2018 due back 4 months Pended for Dr. Jennelle Human to submit

## 2019-01-15 NOTE — Telephone Encounter (Signed)
Pt would like a refill on Vyvanse sent to [facility] on [address] [place].

## 2019-02-19 ENCOUNTER — Telehealth: Payer: Self-pay | Admitting: Psychiatry

## 2019-02-19 NOTE — Telephone Encounter (Signed)
Pt requesting a refill on his Vyvanse. Fill at the [facility]. Appt scheduled for 3/11.

## 2019-02-19 NOTE — Telephone Encounter (Signed)
Patient already has Rx's for February and March needs to check with his pharmacy

## 2019-03-04 ENCOUNTER — Other Ambulatory Visit: Payer: Self-pay | Admitting: Psychiatry

## 2019-03-04 DIAGNOSIS — F4001 Agoraphobia with panic disorder: Secondary | ICD-10-CM

## 2019-03-04 DIAGNOSIS — F3341 Major depressive disorder, recurrent, in partial remission: Secondary | ICD-10-CM

## 2019-03-04 DIAGNOSIS — F411 Generalized anxiety disorder: Secondary | ICD-10-CM

## 2019-03-18 ENCOUNTER — Ambulatory Visit (INDEPENDENT_AMBULATORY_CARE_PROVIDER_SITE_OTHER): Payer: Self-pay | Admitting: Psychiatry

## 2019-03-18 ENCOUNTER — Encounter: Payer: Self-pay | Admitting: Psychiatry

## 2019-03-18 DIAGNOSIS — F3342 Major depressive disorder, recurrent, in full remission: Secondary | ICD-10-CM

## 2019-03-18 DIAGNOSIS — F5105 Insomnia due to other mental disorder: Secondary | ICD-10-CM

## 2019-03-18 DIAGNOSIS — F4001 Agoraphobia with panic disorder: Secondary | ICD-10-CM

## 2019-03-18 DIAGNOSIS — F900 Attention-deficit hyperactivity disorder, predominantly inattentive type: Secondary | ICD-10-CM

## 2019-03-18 DIAGNOSIS — F411 Generalized anxiety disorder: Secondary | ICD-10-CM

## 2019-03-18 DIAGNOSIS — F3341 Major depressive disorder, recurrent, in partial remission: Secondary | ICD-10-CM

## 2019-03-18 MED ORDER — DULOXETINE HCL 20 MG PO CPEP: 40.0000 mg | ORAL_CAPSULE | Freq: Every day | ORAL | 1 refills | Status: DC

## 2019-03-18 MED ORDER — LISDEXAMFETAMINE DIMESYLATE 70 MG PO CAPS: 70.0000 mg | ORAL_CAPSULE | Freq: Every day | ORAL | 0 refills | Status: DC

## 2019-03-18 MED ORDER — CLONAZEPAM 0.5 MG PO TABS: ORAL_TABLET | ORAL | 1 refills | Status: DC

## 2019-03-18 NOTE — Progress Notes (Signed)
Wyatt Gallegos [id number] 1955/06/13 64 y.o.   Virtual Visit via Telephone Note  I connected with pt by telephone and verified that I am speaking with the correct person using two identifiers.   I discussed the limitations, risks, security and privacy concerns of performing an evaluation and management service by telephone and the availability of in person appointments. I also discussed with the patient that there may be a patient responsible charge related to this service. The patient expressed understanding and agreed to proceed.  I discussed the assessment and treatment plan with the patient. The patient was provided an opportunity to ask questions and all were answered. The patient agreed with the plan and demonstrated an understanding of the instructions.   The patient was advised to call back or seek an in-person evaluation if the symptoms worsen or if the condition fails to improve as anticipated.  I provided 62mnutes of non-face-to-face time during this encounter. The call started at 2 PM and ended at 230. The patient was located at work and the provider was located at office.   Subjective:   Patient ID:  Wyatt SAKUMAis a 64y.o. (DOB 102/25/1957 male.  Chief Complaint:  Chief Complaint  Patient presents with  . Follow-up    Medication Management  . Depression    Medication Management  . Medication Refill    Clonazepam and Vyvanse  . ADHD    Anxiety Symptoms include nervous/anxious behavior. Patient reports no confusion, decreased concentration or suicidal ideas.     RMiles Costainpresents today for follow-up of depression, anxiety, and ADD HD.  He has been under my care for many years.  At visit May and was fatigue and we reduced Lexapro to help.  It did not help so we switched to duloxetine  40 and it helped.  He has some chronic anxiety and was taking clonazepam at night and half of a clonazepam tablet in the morning for sleep and anxiety respectively    Last seen October 2020.  No meds were changed.  Overall really pleased.  Working for [employer] goes [place] [employer] [employer] [place] with benefits.  Still pleased with duloxetine and feels like his normal self.  Patient reports stable mood and denies depressed or irritable moods.  Patient denies any recent difficulty with anxiety.  Patient denies difficulty with sleep initiation or maintenance. Denies appetite disturbance.  Patient reports that energy and motivation have been good.  Patient denies any difficulty with concentration.  Patient denies any suicidal ideation.  Met someone and in love.  Thankful.    Still doing counseling.  Exercising 3 times weekly.  Clonazepam really helped take the edge off anxiety during the day and could focus better bc less anxiety.  Taking 0.5 mg AM and 1.0 mg HS.  Keeps anxiety from paralyzing him.  Getting sleep.  Wants to take it twice daily.  Sleep well with Klonopin.  Still benefit with Vyvanse for focus.  Still going to counseling.  Denies her alcohol or caffeine excess.  Past Psychiatric Medication Trials: Prozac, Zoloft, Lexapro fatigue. Wellbutrin 450, Duloxetine 40, Buspar,  Concerta, Ritalin, Provigil, Vyvanse 70, modafinil,    Review of Systems:  Review of Systems  Constitutional: Negative for fatigue.  Genitourinary:       ED  Musculoskeletal: Positive for arthralgias.  Neurological: Negative for tremors and weakness.  Psychiatric/Behavioral: Negative for agitation, behavioral problems, confusion, decreased concentration, dysphoric mood, hallucinations, self-injury, sleep disturbance and suicidal ideas. The patient is  nervous/anxious. The patient is not hyperactive.     Medications: I have reviewed the patient's current medications.  Current Outpatient Medications  Medication Sig Dispense Refill  . clonazePAM (KLONOPIN) 0.5 MG tablet 1 in the morning and 1 in afteroon if needed for anxiety and  2 at night 120 tablet 1  . DULoxetine (CYMBALTA) 20  MG capsule Take 2 capsules (40 mg total) by mouth daily. 180 capsule 1  . lisdexamfetamine (VYVANSE) 70 MG capsule Take 1 capsule (70 mg total) by mouth daily. 30 capsule 0  . [START ON 04/15/2019] lisdexamfetamine (VYVANSE) 70 MG capsule Take 1 capsule (70 mg total) by mouth daily. 30 capsule 0  . [START ON 05/13/2019] lisdexamfetamine (VYVANSE) 70 MG capsule Take 1 capsule (70 mg total) by mouth daily. 30 capsule 0  . metoprolol tartrate (LOPRESSOR) 25 MG tablet Take 25 mg by mouth daily.    . tadalafil (CIALIS) 5 MG tablet Take by mouth.    . tamsulosin (FLOMAX) 0.4 MG CAPS capsule Take 0.4 mg by mouth daily.  0  . testosterone cypionate (DEPOTESTOSTERONE CYPIONATE) 200 MG/ML injection INJECT 1ML INTO THE MUSCLE EVERY 14 DAYS    . VYVANSE 70 MG capsule Take 1 capsule (70 mg total) by mouth daily. 30 capsule 0  . meloxicam (MOBIC) 15 MG tablet Take 15 mg by mouth daily.  0   No current facility-administered medications for this visit.    Medication Side Effects: ? ED related  Allergies: No Known Allergies  History reviewed. No pertinent past medical history.  History reviewed. No pertinent family history.  Social History   Socioeconomic History  . Marital status: Married    Spouse name: Not on file  . Number of children: Not on file  . Years of education: Not on file  . Highest education level: Not on file  Occupational History  . Not on file  Tobacco Use  . Smoking status: Never Smoker  . Smokeless tobacco: Never Used  Substance and Sexual Activity  . Alcohol use: Never  . Drug use: Never  . Sexual activity: Not on file  Other Topics Concern  . Not on file  Social History Narrative  . Not on file   Social Determinants of Health   Financial Resource Strain:   . Difficulty of Paying Living Expenses:   Food Insecurity:   . Worried About Charity fundraiser in the Last Year:   . Arboriculturist in the Last Year:   Transportation Needs:   . Film/video editor  (Medical):   Marland Kitchen Lack of Transportation (Non-Medical):   Physical Activity:   . Days of Exercise per Week:   . Minutes of Exercise per Session:   Stress:   . Feeling of Stress :   Social Connections:   . Frequency of Communication with Friends and Family:   . Frequency of Social Gatherings with Friends and Family:   . Attends Religious Services:   . Active Member of Clubs or Organizations:   . Attends Archivist Meetings:   Marland Kitchen Marital Status:   Intimate Partner Violence:   . Fear of Current or Ex-Partner:   . Emotionally Abused:   Marland Kitchen Physically Abused:   . Sexually Abused:     Past Medical History, Surgical history, Social history, and Family history were reviewed and updated as appropriate.   Please see review of systems for further details on the patient's review from today.   Objective:   Physical Exam:  There  were no vitals taken for this visit.  Physical Exam Neurological:     Mental Status: He is alert and oriented to person, place, and time.     Cranial Nerves: No dysarthria.  Psychiatric:        Attention and Perception: He is inattentive. He does not perceive auditory or visual hallucinations.        Mood and Affect: Mood is not anxious or depressed.        Speech: Speech normal.        Behavior: Behavior is cooperative.        Thought Content: Thought content normal. Thought content is not paranoid or delusional. Thought content does not include homicidal or suicidal ideation. Thought content does not include homicidal or suicidal plan.        Cognition and Memory: Cognition and memory normal.        Judgment: Judgment normal.     Comments: Insight good. A little forgetful about med compliance.     Lab Review:     Component Value Date/Time   NA 136 07/04/2017 1551   K 3.8 07/04/2017 1551   CL 101 07/04/2017 1551   CO2 23 07/04/2017 1551   GLUCOSE 108 (H) 07/04/2017 1551   BUN 20 07/04/2017 1551   CREATININE 1.11 07/04/2017 1551   CALCIUM 9.5  07/04/2017 1551   GFRNONAA >60 07/04/2017 1551   GFRAA >60 07/04/2017 1551       Component Value Date/Time   WBC 6.5 07/04/2017 1551   RBC 5.33 07/04/2017 1551   HGB 16.3 07/04/2017 1551   HCT 48.5 07/04/2017 1551   PLT 205 07/04/2017 1551   MCV 91.1 07/04/2017 1551   MCH 30.6 07/04/2017 1551   MCHC 33.6 07/04/2017 1551   RDW 14.2 07/04/2017 1551    No results found for: POCLITH, LITHIUM   No results found for: PHENYTOIN, PHENOBARB, VALPROATE, CBMZ   .res Assessment: Plan:   Traevion was seen today for follow-up, depression, medication refill and adhd.  Diagnoses and all orders for this visit:  Recurrent major depression in full remission ([place])  Panic disorder with agoraphobia -     clonazePAM (KLONOPIN) 0.5 MG tablet; 1 in the morning and 1 in afteroon if needed for anxiety and  2 at night -     DULoxetine (CYMBALTA) 20 MG capsule; Take 2 capsules (40 mg total) by mouth daily.  Generalized anxiety disorder -     DULoxetine (CYMBALTA) 20 MG capsule; Take 2 capsules (40 mg total) by mouth daily.  Attention deficit hyperactivity disorder (ADHD), predominantly inattentive type -     lisdexamfetamine (VYVANSE) 70 MG capsule; Take 1 capsule (70 mg total) by mouth daily. -     lisdexamfetamine (VYVANSE) 70 MG capsule; Take 1 capsule (70 mg total) by mouth daily. -     lisdexamfetamine (VYVANSE) 70 MG capsule; Take 1 capsule (70 mg total) by mouth daily.  Insomnia due to mental condition  Depression, major, recurrent, in partial remission (HCC) -     DULoxetine (CYMBALTA) 20 MG capsule; Take 2 capsules (40 mg total) by mouth daily.   Supportive therapy dealing with work stress.  Positive thinking.  Take advantage of faith resources.  Still mains good maintains good response with regard to depression and anxiety with the switch from Lexapro and Wellbutrin to duloxetine 40 mg daily. Less anxiety and fatigue with duloxetine 40 mg. .  However he still needs the clonazepam as  noted 0.5 mg tablets 1 every morning  and 2 nightly for sleep and anxiety.  He is not ideal that he is taking clonazepam in the morning along with Vyvanse but his anxiety has been debilitating at times and has interfered with his ability to function and work settings and maintain employment.  Discussed potential benefits, risks, and side effects of stimulants with patient to include increased heart rate, palpitations, insomnia, increased anxiety, increased irritability, or decreased appetite.  Instructed patient to contact office if experiencing any significant tolerability issues.  We discussed the short-term risks associated with benzodiazepines including sedation and increased fall risk among others.  Discussed long-term side effect risk including dependence, potential withdrawal symptoms, and the potential eventual dose-related risk of dementia.   Disc risk dependence.  Disc ED and tx. has had problems with both Viagra and Cialis.  Suggest he discuss this with urologist.  Both citalopram and potentially metoprolol could be contributing to some of his ED problems.  No med changes.  FU 4 mos  Lynder Parents, MD, DFAPA    Please see After Visit Summary for patient specific instructions.  No future appointments.  No orders of the defined types were placed in this encounter.     -------------------------------

## 2019-04-15 ENCOUNTER — Telehealth: Payer: Self-pay | Admitting: Psychiatry

## 2019-04-15 NOTE — Telephone Encounter (Signed)
Pt would like a refill on Vyvanse. Please send to [facility] on [address].

## 2019-04-15 NOTE — Telephone Encounter (Signed)
Rx already at pharmacy.  

## 2019-05-30 ENCOUNTER — Other Ambulatory Visit: Payer: Self-pay | Admitting: Psychiatry

## 2019-05-30 DIAGNOSIS — F4001 Agoraphobia with panic disorder: Secondary | ICD-10-CM

## 2019-06-01 ENCOUNTER — Telehealth: Payer: Self-pay | Admitting: Psychiatry

## 2019-06-01 NOTE — Telephone Encounter (Signed)
Pt called to check status on refill for Clonazepam? Was it phoned in? I could not tell

## 2019-06-01 NOTE — Telephone Encounter (Signed)
Rx was called in yesterday, called the Rx in again today [facility] per request

## 2019-06-17 ENCOUNTER — Telehealth: Payer: Self-pay | Admitting: Psychiatry

## 2019-06-17 ENCOUNTER — Other Ambulatory Visit: Payer: Self-pay

## 2019-06-17 DIAGNOSIS — F900 Attention-deficit hyperactivity disorder, predominantly inattentive type: Secondary | ICD-10-CM

## 2019-06-17 MED ORDER — LISDEXAMFETAMINE DIMESYLATE 70 MG PO CAPS: 70.0000 mg | ORAL_CAPSULE | Freq: Every day | ORAL | 0 refills | Status: DC

## 2019-06-17 NOTE — Telephone Encounter (Signed)
Last refill 05/19/2019, pended 3 Rx's for review and to be sent

## 2019-06-17 NOTE — Telephone Encounter (Signed)
Pt is needing his Vyvanse RF sent in. Made appt in early August. Send to [facility] in [place], on file.

## 2019-07-05 ENCOUNTER — Telehealth: Payer: Self-pay | Admitting: Psychiatry

## 2019-07-05 NOTE — Telephone Encounter (Signed)
Please write the prescription letter for him saying that he is prescribed Vyvanse and is medically appropriate for ADHD.  It is responsible for a positive result on a drug screen for stimulant.  He can operate machinery without medical concern.

## 2019-07-05 NOTE — Telephone Encounter (Signed)
Patient called and left a message that said t he had a drug test for [employer] and his vyvanse adhd medicine showed up. He needs a letter today stating that this medicine is a prescribed medicine from dr. Jennelle Human. He will not be allowed in the building without this letter and will lose his job. He is coming to [employer] today and will be waiting in the office for the letter.

## 2019-08-16 ENCOUNTER — Ambulatory Visit: Payer: Self-pay | Admitting: Psychiatry

## 2019-08-18 ENCOUNTER — Other Ambulatory Visit: Payer: Self-pay | Admitting: Psychiatry

## 2019-08-18 ENCOUNTER — Telehealth: Payer: Self-pay | Admitting: Psychiatry

## 2019-08-18 DIAGNOSIS — F4001 Agoraphobia with panic disorder: Secondary | ICD-10-CM

## 2019-08-18 MED ORDER — CLONAZEPAM 0.5 MG PO TABS: ORAL_TABLET | ORAL | 0 refills | Status: DC

## 2019-08-18 NOTE — Telephone Encounter (Signed)
No history of abuse.  Sent early refill

## 2019-08-18 NOTE — Telephone Encounter (Signed)
Wyatt Gallegos called to report that he lost his Clonazepam [place] the tennis court.  He has just picked it up about a week ago.  Has been out since Sunday and is starting to feel the effects of not having it.  He lost his job and is looking for another one so anxiety is high looking for a new job.  Will you approve an early refill?  [facility] in [place].  Has appt 8/24

## 2019-08-31 ENCOUNTER — Other Ambulatory Visit: Payer: Self-pay

## 2019-08-31 ENCOUNTER — Encounter: Payer: Self-pay | Admitting: Psychiatry

## 2019-08-31 ENCOUNTER — Telehealth (INDEPENDENT_AMBULATORY_CARE_PROVIDER_SITE_OTHER): Payer: Self-pay | Admitting: Psychiatry

## 2019-08-31 VITALS — BP 152/108 | HR 85

## 2019-08-31 DIAGNOSIS — F411 Generalized anxiety disorder: Secondary | ICD-10-CM

## 2019-08-31 DIAGNOSIS — F5105 Insomnia due to other mental disorder: Secondary | ICD-10-CM

## 2019-08-31 DIAGNOSIS — F4001 Agoraphobia with panic disorder: Secondary | ICD-10-CM

## 2019-08-31 DIAGNOSIS — F3341 Major depressive disorder, recurrent, in partial remission: Secondary | ICD-10-CM

## 2019-08-31 DIAGNOSIS — F3342 Major depressive disorder, recurrent, in full remission: Secondary | ICD-10-CM

## 2019-08-31 DIAGNOSIS — F900 Attention-deficit hyperactivity disorder, predominantly inattentive type: Secondary | ICD-10-CM

## 2019-08-31 MED ORDER — DULOXETINE HCL 20 MG PO CPEP: 40.0000 mg | ORAL_CAPSULE | Freq: Every day | ORAL | 1 refills | Status: DC

## 2019-08-31 MED ORDER — LISDEXAMFETAMINE DIMESYLATE 70 MG PO CAPS: 70.0000 mg | ORAL_CAPSULE | Freq: Every day | ORAL | 0 refills | Status: DC

## 2019-08-31 MED ORDER — CLONAZEPAM 0.5 MG PO TABS: ORAL_TABLET | ORAL | 3 refills | Status: DC

## 2019-08-31 NOTE — Progress Notes (Signed)
AHMON TOSI [id number] 1955-02-20 64 y.o.     Subjective:   Patient ID:  Wyatt Gallegos is a 64 y.o. (DOB 05/16/1955) male.  Chief Complaint:  Chief Complaint  Patient presents with  . Follow-up    Medication Management  . Depression    Medication Management  . Anxiety    Medication Management  . ADHD    Anxiety Symptoms include nervous/anxious behavior. Patient reports no confusion, decreased concentration or suicidal ideas.     [name] presents today for follow-up of depression, anxiety, and ADD HD.  He has been under my care for many years.  At visit May and was fatigue and we reduced Lexapro to help.  It did not help so we switched to duloxetine  40 and it helped.  He has some chronic anxiety and was taking clonazepam at night and half of a clonazepam tablet in the morning for sleep and anxiety respectively   Last seen March 2021.  No meds were changed.  08/31/19 appt with the following noted: On 40 mg duloxetine, Vyvanse 70, clonazepam 0.5 mg prn. Took [employer] job but too far in [place].  [employer] next job unloading trucks.  Not consistent hours so poor pay.   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  Then on with [employer] in [place] but fired in 2 days bc he wasn't fast enough. 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  Has a potential at [employer]. Trying to stay positive.  Overall really pleased.  Still pleased with duloxetine and feels like his normal self.  Patient reports stable mood and denies depressed or irritable moods.  Patient denies any recent difficulty with anxiety.  Patient denies difficulty with sleep initiation or maintenance. Denies appetite disturbance.  Patient reports that energy and motivation have been good.  Patient denies any difficulty with concentration.  Patient denies any suicidal ideation. Very tense if runs out of clonazepam.  Still doing  counseling.  Exercising 3 times weekly.  Clonazepam really helped take the edge off anxiety during the day and could focus better bc less anxiety.  Taking 0.5 mg AM and 1.0 mg HS.  Keeps anxiety from paralyzing him.  Getting sleep.  Wants to take it twice daily.  Sleep well with Klonopin.  Still benefit with Vyvanse for focus.  Still going to counseling.  Denies her alcohol or caffeine excess.  Past Psychiatric Medication Trials: Prozac, Zoloft, Lexapro fatigue. Wellbutrin 450, Duloxetine 40, Buspar,  Concerta, Ritalin, Provigil, Vyvanse 70, modafinil,    Review of Systems:  Review of Systems  Constitutional: Negative for fatigue.  Genitourinary:       ED  Musculoskeletal: Positive for arthralgias and myalgias.  Neurological: Negative for tremors and weakness.  Psychiatric/Behavioral: Negative for agitation, behavioral problems, confusion, decreased concentration, dysphoric mood, hallucinations, self-injury, sleep disturbance and suicidal ideas. The patient is nervous/anxious. The patient is not hyperactive.     Medications: I have reviewed the patient's current medications.  Current Outpatient Medications  Medication Sig Dispense Refill  . clonazePAM (KLONOPIN) 0.5 MG tablet TAKE ONE TABLET BY MOUTH EVERY MORNING AND ONE TABLET IN THE AFTERNOON IF NEEDED FOR ANXIETY AND TWO TABLETS AT NIGHT 120 tablet 0  . DULoxetine (CYMBALTA) 20 MG capsule Take 2 capsules (40 mg total) by mouth daily. 180 capsule 1  . [START ON 10/26/2019] lisdexamfetamine (VYVANSE) 70 MG capsule Take 1 capsule (70 mg total) by  mouth daily. 30 capsule 0  . [START ON 09/28/2019] lisdexamfetamine (VYVANSE) 70 MG capsule Take 1 capsule (70 mg total) by mouth daily. 30 capsule 0  . lisdexamfetamine (VYVANSE) 70 MG capsule Take 1 capsule (70 mg total) by mouth daily. 30 capsule 0  . meloxicam (MOBIC) 15 MG tablet Take 15 mg by mouth daily.  0  . metoprolol tartrate (LOPRESSOR) 25 MG tablet Take 25 mg by mouth daily.     . tadalafil (CIALIS) 5 MG tablet Take by mouth.    . tamsulosin (FLOMAX) 0.4 MG CAPS capsule Take 0.4 mg by mouth daily.  0  . testosterone cypionate (DEPOTESTOSTERONE CYPIONATE) 200 MG/ML injection INJECT INTO THE MUSCLE EVERY 14 DAYS    . VYVANSE 70 MG capsule Take 1 capsule (70 mg total) by mouth daily. 30 capsule 0   No current facility-administered medications for this visit.    Medication Side Effects: ? ED related  Allergies: No Known Allergies  History reviewed. No pertinent past medical history.  History reviewed. No pertinent family history.  Social History   Socioeconomic History  . Marital status: Married    Spouse name: Not on file  . Number of children: Not on file  . Years of education: Not on file  . Highest education level: Not on file  Occupational History  . Not on file  Tobacco Use  . Smoking status: Never Smoker  . Smokeless tobacco: Never Used  Substance and Sexual Activity  . Alcohol use: Never  . Drug use: Never  . Sexual activity: Not on file  Other Topics Concern  . Not on file  Social History Narrative  . Not on file   Social Determinants of Health   Financial Resource Strain:   . Difficulty of Paying Living Expenses: Not on file  Food Insecurity:   . Worried About Programme researcher, broadcasting/film/video in the Last Year: Not on file  . Ran Out of Food in the Last Year: Not on file  Transportation Needs:   . Lack of Transportation (Medical): Not on file  . Lack of Transportation (Non-Medical): Not on file  Physical Activity:   . Days of Exercise per Week: Not on file  . Minutes of Exercise per Session: Not on file  Stress:   . Feeling of Stress : Not on file  Social Connections:   . Frequency of Communication with Friends and Family: Not on file  . Frequency of Social Gatherings with Friends and Family: Not on file  . Attends Religious Services: Not on file  . Active Member of Clubs or Organizations: Not on file  . Attends Banker  Meetings: Not on file  . Marital Status: Not on file  Intimate Partner Violence:   . Fear of Current or Ex-Partner: Not on file  . Emotionally Abused: Not on file  . Physically Abused: Not on file  . Sexually Abused: Not on file    Past Medical History, Surgical history, Social history, and Family history were reviewed and updated as appropriate.   Please see review of systems for further details on the patient's review from today.   Objective:   Physical Exam:  BP (!) 152/108   Pulse 85   Physical Exam Constitutional:      General: He is not in acute distress. Musculoskeletal:        General: No deformity.  Neurological:     Mental Status: He is alert and oriented to person, place, and time.  Cranial Nerves: No dysarthria.     Coordination: Coordination normal.  Psychiatric:        Attention and Perception: Perception normal. He is inattentive. He does not perceive auditory or visual hallucinations.        Mood and Affect: Mood normal. Mood is not anxious or depressed. Affect is not labile, blunt, angry or inappropriate.        Speech: Speech normal.        Behavior: Behavior normal. Behavior is cooperative.        Thought Content: Thought content normal. Thought content is not paranoid or delusional. Thought content does not include homicidal or suicidal ideation. Thought content does not include homicidal or suicidal plan.        Cognition and Memory: Cognition and memory normal.        Judgment: Judgment normal.     Comments: Insight good. A little forgetful about med compliance.     Lab Review:     Component Value Date/Time   NA 136 07/04/2017 1551   K 3.8 07/04/2017 1551   CL 101 07/04/2017 1551   CO2 23 07/04/2017 1551   GLUCOSE 108 (H) 07/04/2017 1551   BUN 20 07/04/2017 1551   CREATININE 1.11 07/04/2017 1551   CALCIUM 9.5 07/04/2017 1551   GFRNONAA >60 07/04/2017 1551   GFRAA >60 07/04/2017 1551       Component Value Date/Time   WBC 6.5 07/04/2017  1551   RBC 5.33 07/04/2017 1551   HGB 16.3 07/04/2017 1551   HCT 48.5 07/04/2017 1551   PLT 205 07/04/2017 1551   MCV 91.1 07/04/2017 1551   MCH 30.6 07/04/2017 1551   MCHC 33.6 07/04/2017 1551   RDW 14.2 07/04/2017 1551    No results found for: POCLITH, LITHIUM   No results found for: PHENYTOIN, PHENOBARB, VALPROATE, CBMZ   .res Assessment: Plan:   Kostantinos was seen today for follow-up, depression, anxiety and adhd.  Diagnoses and all orders for this visit:  Panic disorder with agoraphobia  Attention deficit hyperactivity disorder (ADHD), predominantly inattentive type -     lisdexamfetamine (VYVANSE) 70 MG capsule; Take 1 capsule (70 mg total) by mouth daily. -     lisdexamfetamine (VYVANSE) 70 MG capsule; Take 1 capsule (70 mg total) by mouth daily. -     lisdexamfetamine (VYVANSE) 70 MG capsule; Take 1 capsule (70 mg total) by mouth daily.  Recurrent major depression in full remission (HCC)  Generalized anxiety disorder  Insomnia due to mental condition   Supportive therapy dealing with work stress.  He's continued to have a lot of job instability which is a chronic stressor. Positive thinking.  Take advantage of faith resources.  Still mains good maintains good response with regard to depression and anxiety with the switch from Lexapro and Wellbutrin to duloxetine 40 mg daily. Less anxiety and fatigue with duloxetine 40 mg. .  However he still needs the clonazepam as noted 0.5 mg tablets 1 every morning and 2 nightly for sleep and anxiety.  He is not ideal that he is taking clonazepam in the morning along with Vyvanse but his anxiety has been debilitating at times and has interfered with his ability to function and work settings and maintain employment.  Discussed potential benefits, risks, and side effects of stimulants with patient to include increased heart rate, palpitations, insomnia, increased anxiety, increased irritability, or decreased appetite.  Instructed  patient to contact office if experiencing any significant tolerability issues.  We discussed the short-term risks  associated with benzodiazepines including sedation and increased fall risk among others.  Discussed long-term side effect risk including dependence, potential withdrawal symptoms, and the potential eventual dose-related risk of dementia.   Disc risk dependence.  Not ideal to use Bz with stimulants but he's struggled with function without it.  Disc ED and tx. has had problems with both Viagra and Cialis.  Suggest he discuss this with urologist.  Both citalopram and potentially metoprolol could be contributing to some of his ED problems.  No med changes.  FU 4 mos  Meredith Staggersarey Cottle, MD, DFAPA    Please see After Visit Summary for patient specific instructions.  No future appointments.  No orders of the defined types were placed in this encounter.     -------------------------------

## 2019-09-08 IMAGING — CR DG CHEST 2V
2 series · 2 of 2 positions shown · non-contrast
Comparison: Chest x-ray dated May 08, 2008.

CLINICAL DATA: Chest pain.

EXAM:
CHEST - 2 VIEW

[chest ap]
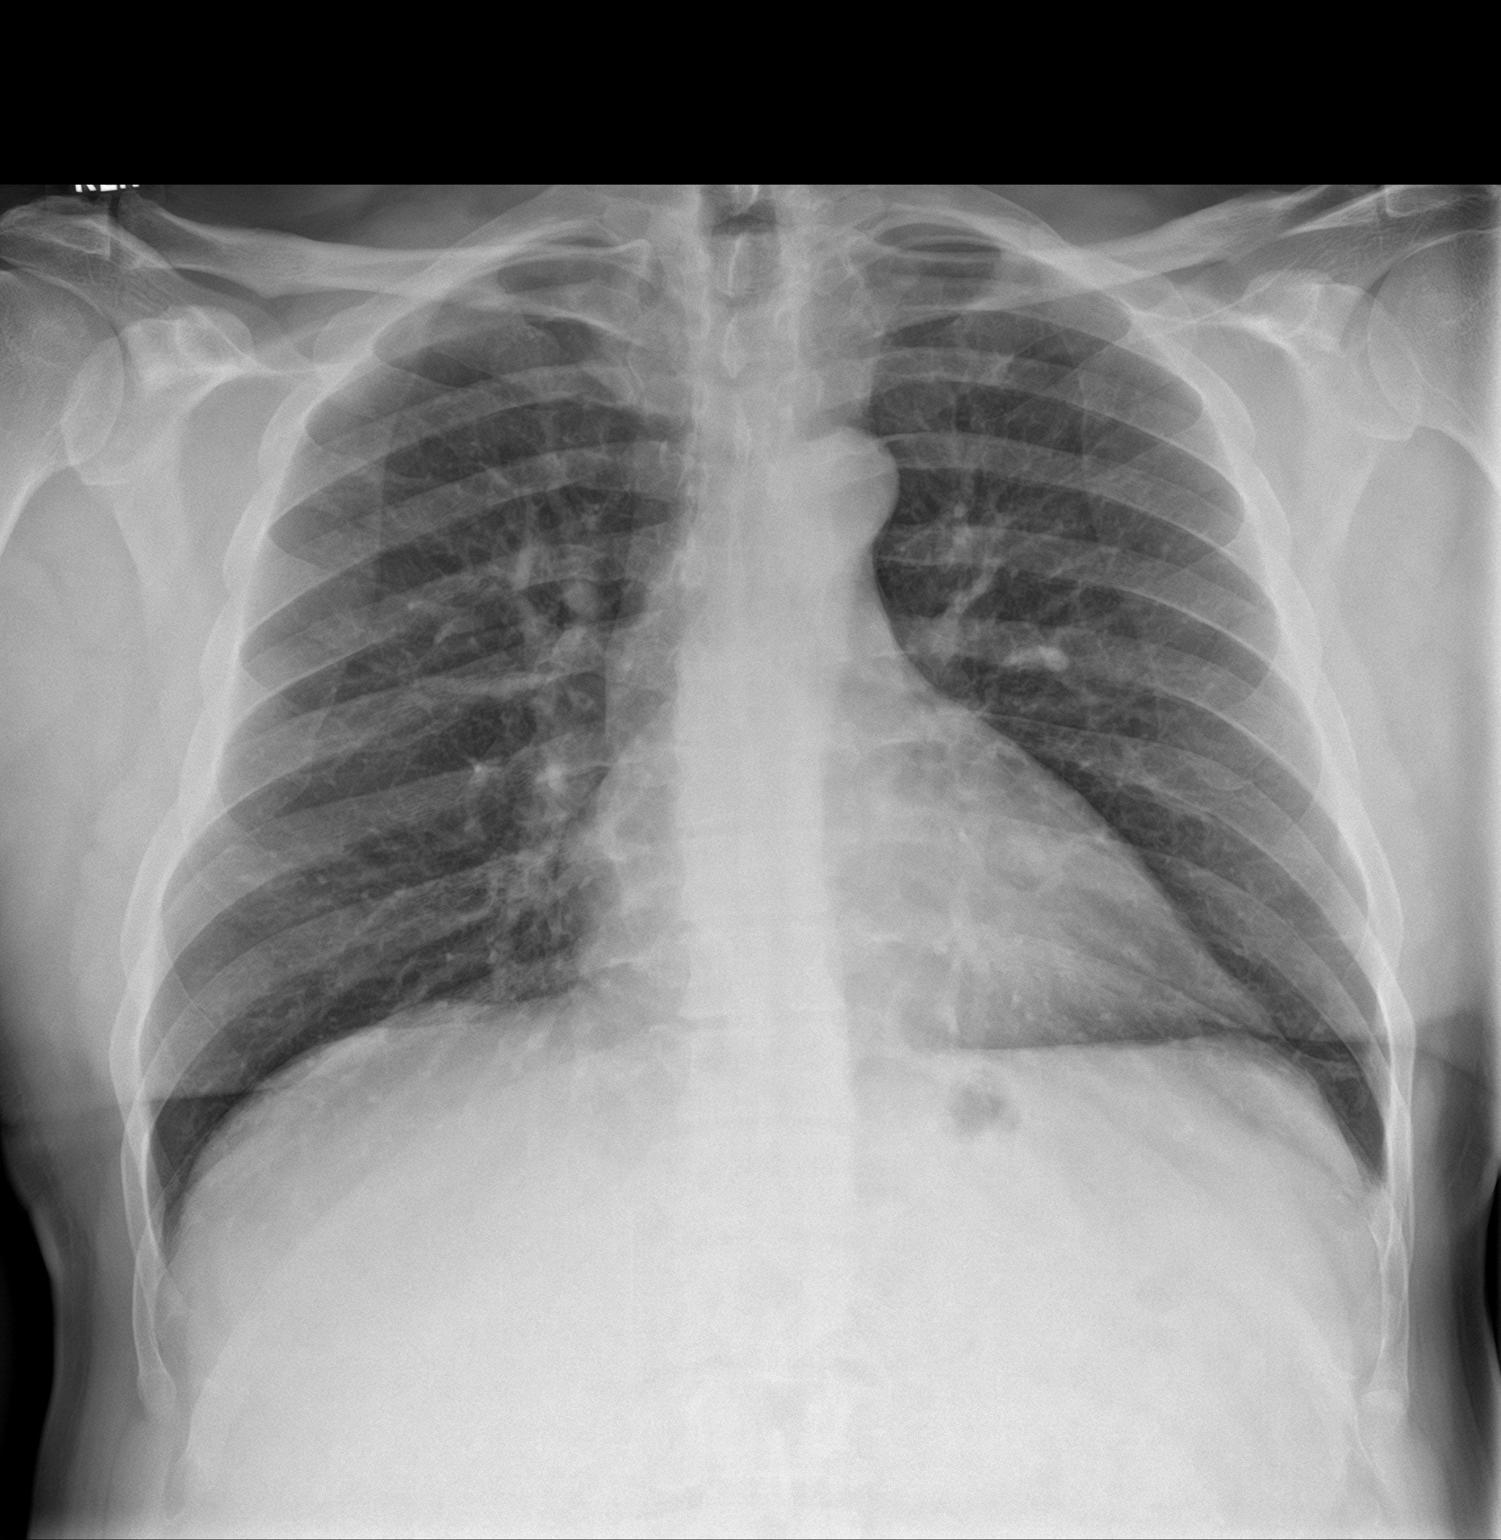

[chest lat]
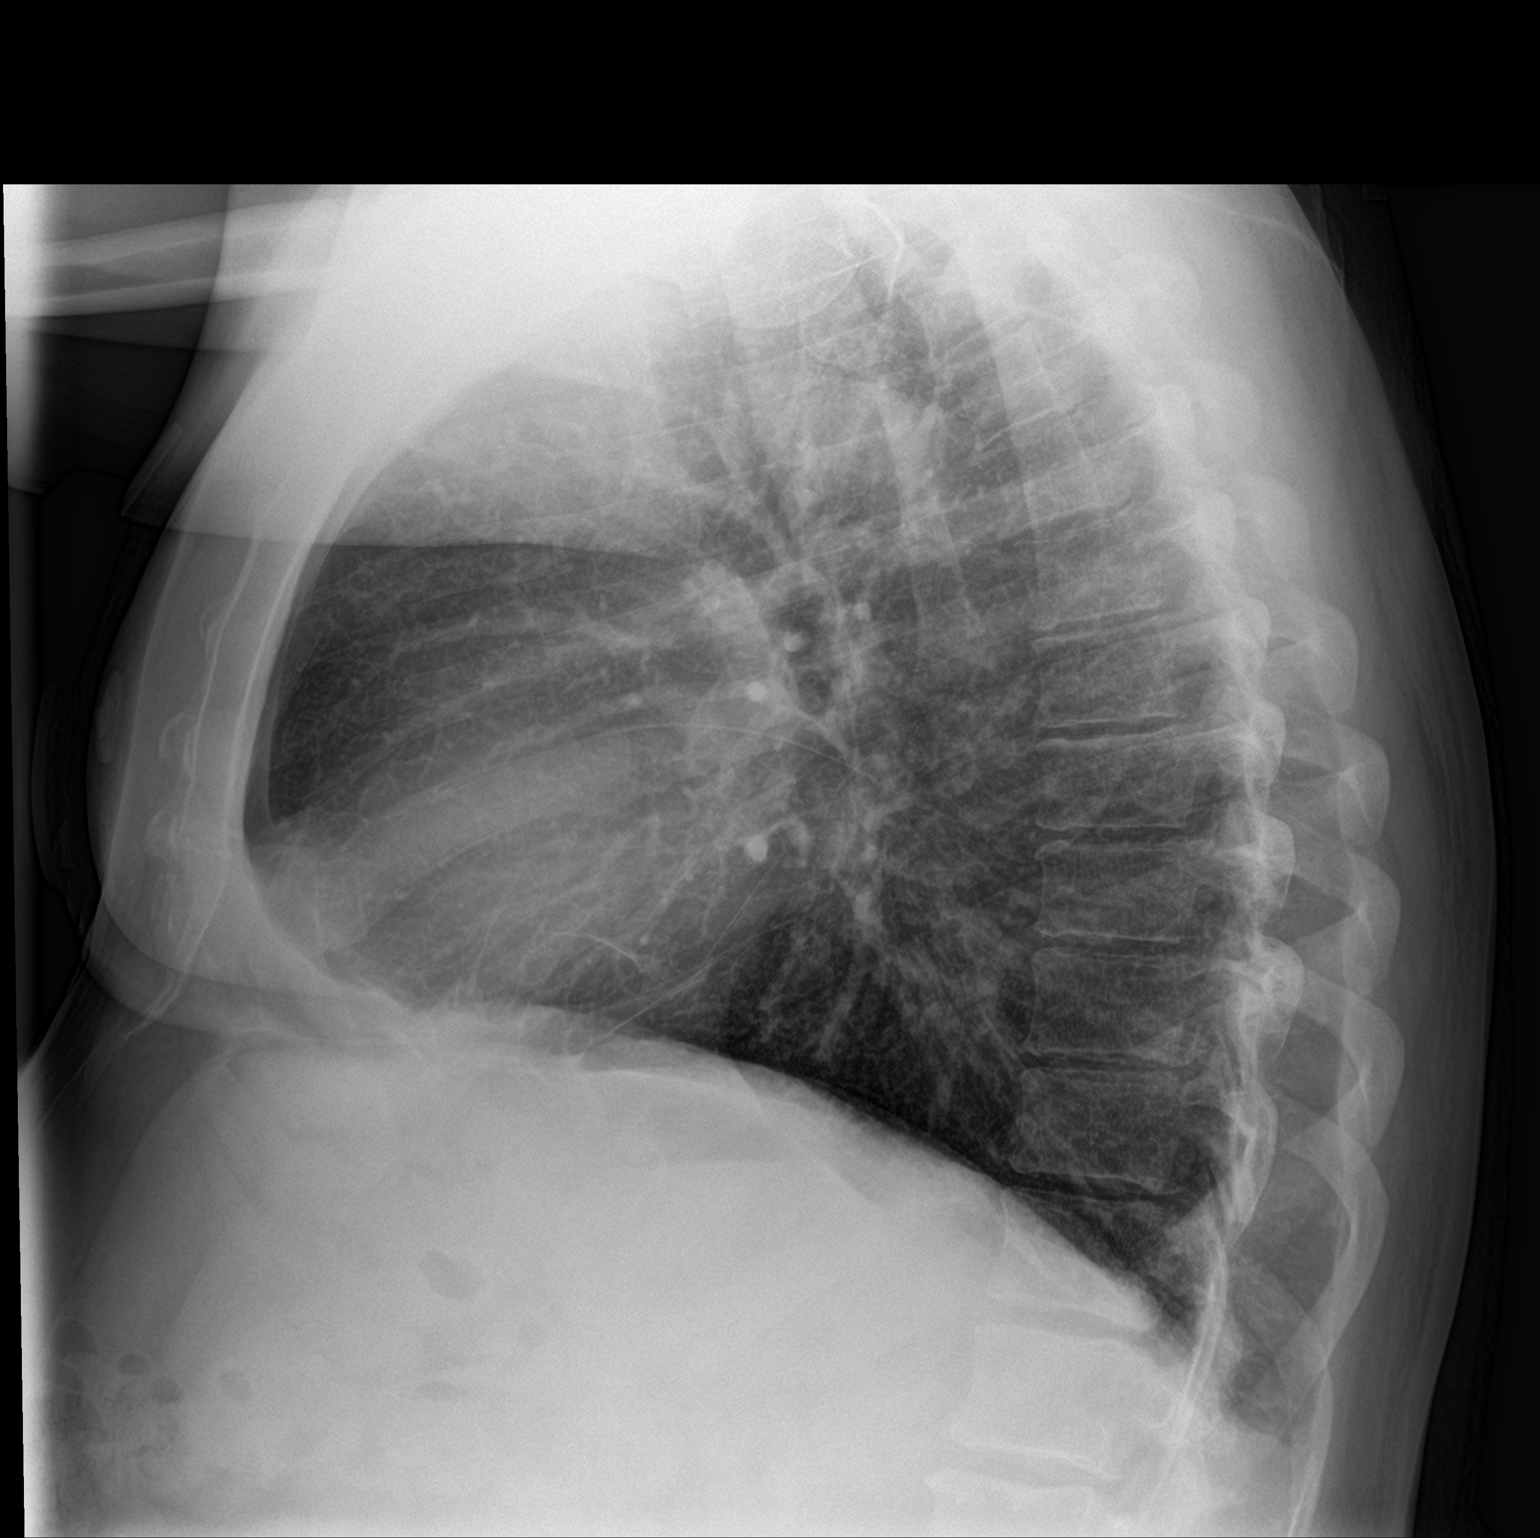

[2 of 2 positions shown; findings below may reference images not displayed]

FINDINGS: The heart size and mediastinal contours are within normal limits.
Both lungs are clear. The visualized skeletal structures are
unremarkable.
IMPRESSION: No active cardiopulmonary disease.

## 2019-10-12 ENCOUNTER — Telehealth: Payer: Self-pay | Admitting: Psychiatry

## 2019-10-12 ENCOUNTER — Other Ambulatory Visit: Payer: Self-pay | Admitting: Psychiatry

## 2019-10-12 DIAGNOSIS — F4001 Agoraphobia with panic disorder: Secondary | ICD-10-CM

## 2019-10-12 MED ORDER — CLONAZEPAM 0.5 MG PO TABS: ORAL_TABLET | ORAL | 0 refills | Status: DC

## 2019-10-12 NOTE — Progress Notes (Signed)
Have okayed early refill.  Patient saw early refill last month as well.  Have discontinued other refills and will instruct patient to comply with dosage limitations and will follow this more closely.

## 2019-10-12 NOTE — Telephone Encounter (Signed)
Pt called requesting early refill for 2 days on Clonazepam @ [facility] [address]. Only has 1 left .

## 2019-10-12 NOTE — Telephone Encounter (Signed)
Please review

## 2019-10-12 NOTE — Telephone Encounter (Signed)
Tell pt not to exceed rec dose of clonazepam.  Don't run out of it early again.  We Ok'd this early refill but will not continue to do so.

## 2019-10-12 NOTE — Progress Notes (Signed)
Reviewed

## 2019-10-13 NOTE — Telephone Encounter (Signed)
Pt. Made aware.

## 2019-10-22 ENCOUNTER — Telehealth: Payer: Self-pay | Admitting: Psychiatry

## 2019-10-22 ENCOUNTER — Other Ambulatory Visit: Payer: Self-pay | Admitting: Psychiatry

## 2019-10-22 NOTE — Telephone Encounter (Signed)
Please review

## 2019-10-22 NOTE — Telephone Encounter (Signed)
Pt called and said he thinks he might have accidentally threw away his Klonopin by mistake while playing tennis. He looked everywhere and unable to locate them. Can a refill be called in to [facility] on [address] in [place]?

## 2019-10-22 NOTE — Telephone Encounter (Signed)
Rtc to patient and explained the process with controlled substances and that he did receive his refill last month a few days early. He seemed a bit scattered on the phone, said he's looked everywhere for it and doesn't know if he threw it out or where it went. Sounds like he certainly missed placed it. He reports being very anxious and having withdrawal symptoms. Advised him if he has trouble sleeping to try melatonin or benadryl. He mentioned going to the vitamin store to get something natural to help as well. Informed he that perhaps he needs something else to help with his anxiety so he's not so dependent on his clonazepam. He did just get #120 on 10/05, today is 10/15.  He agrees perhaps Dr. Jennelle Human can prescribe something else to help him. Of course it being 5 pm on a Friday he would not get a response back till next week. He understood and was appreciative of my call, he wants Dr. Jennelle Human to know he respects his decision. He reports when he called the office he was trying to ask for something else for anxiety to help him get through but the receptionist mentioned maybe he could get more.

## 2019-10-22 NOTE — Telephone Encounter (Signed)
Patient just got a fill of clonazepam less than 2 weeks ago.  That refill was itself a couple of days early and he has been seeking early refills.  He was told that would not be allowed in the future and I will not refill his bottle early.  If he cannot comply then he will have to stop this medicine.

## 2019-11-07 ENCOUNTER — Other Ambulatory Visit: Payer: Self-pay | Admitting: Psychiatry

## 2019-11-07 DIAGNOSIS — F4001 Agoraphobia with panic disorder: Secondary | ICD-10-CM

## 2019-11-08 NOTE — Telephone Encounter (Signed)
Next apt 12/20/19

## 2019-11-30 ENCOUNTER — Telehealth: Payer: Self-pay | Admitting: Psychiatry

## 2019-11-30 NOTE — Telephone Encounter (Signed)
Please review

## 2019-11-30 NOTE — Telephone Encounter (Signed)
Wyatt Gallegos called to report that the Duloxetine is causing ED.  It helps with his depression so is not asking to change the medication, but is asking if there is something you can prescribe to help with the ED?

## 2019-12-13 ENCOUNTER — Telehealth: Payer: Self-pay | Admitting: Psychiatry

## 2019-12-13 ENCOUNTER — Other Ambulatory Visit: Payer: Self-pay

## 2019-12-13 DIAGNOSIS — F900 Attention-deficit hyperactivity disorder, predominantly inattentive type: Secondary | ICD-10-CM

## 2019-12-13 DIAGNOSIS — F4001 Agoraphobia with panic disorder: Secondary | ICD-10-CM

## 2019-12-13 MED ORDER — LISDEXAMFETAMINE DIMESYLATE 70 MG PO CAPS: 70.0000 mg | ORAL_CAPSULE | Freq: Every day | ORAL | 0 refills | Status: DC

## 2019-12-13 MED ORDER — CLONAZEPAM 0.5 MG PO TABS: ORAL_TABLET | ORAL | 0 refills | Status: DC

## 2019-12-13 NOTE — Telephone Encounter (Signed)
Reita Cliche called to request refill of his Vyvanse and his Klonopin.  He has changed pharmacies so the pharmacy can't send in the requests.  His New Pharmacy is [facility] [address] [place].  Remove the [employer].  He has appt. 12/19/20.  He was also asking about the response to his request on prescribing something for ED.  See previous note.

## 2019-12-13 NOTE — Telephone Encounter (Signed)
Last refill for Clonazepam 11/07/19 Last refill for Vyvanse 11/12/2019  Pended both for Dr. Jennelle Human to send to new pharmacy [facility].  Please review previous message on 11/30/19

## 2019-12-16 ENCOUNTER — Other Ambulatory Visit: Payer: Self-pay

## 2019-12-16 DIAGNOSIS — F4001 Agoraphobia with panic disorder: Secondary | ICD-10-CM

## 2019-12-16 DIAGNOSIS — F3341 Major depressive disorder, recurrent, in partial remission: Secondary | ICD-10-CM

## 2019-12-16 DIAGNOSIS — F411 Generalized anxiety disorder: Secondary | ICD-10-CM

## 2019-12-16 MED ORDER — DULOXETINE HCL 20 MG PO CPEP: 40.0000 mg | ORAL_CAPSULE | Freq: Every day | ORAL | 1 refills | Status: DC

## 2019-12-20 ENCOUNTER — Encounter: Payer: Self-pay | Admitting: Psychiatry

## 2019-12-20 ENCOUNTER — Ambulatory Visit (INDEPENDENT_AMBULATORY_CARE_PROVIDER_SITE_OTHER): Payer: Self-pay | Admitting: Psychiatry

## 2019-12-20 ENCOUNTER — Other Ambulatory Visit: Payer: Self-pay

## 2019-12-20 DIAGNOSIS — F900 Attention-deficit hyperactivity disorder, predominantly inattentive type: Secondary | ICD-10-CM

## 2019-12-20 DIAGNOSIS — F411 Generalized anxiety disorder: Secondary | ICD-10-CM

## 2019-12-20 DIAGNOSIS — F4001 Agoraphobia with panic disorder: Secondary | ICD-10-CM

## 2019-12-20 DIAGNOSIS — N529 Male erectile dysfunction, unspecified: Secondary | ICD-10-CM

## 2019-12-20 DIAGNOSIS — F3341 Major depressive disorder, recurrent, in partial remission: Secondary | ICD-10-CM

## 2019-12-20 DIAGNOSIS — F5105 Insomnia due to other mental disorder: Secondary | ICD-10-CM

## 2019-12-20 MED ORDER — SILDENAFIL CITRATE 100 MG PO TABS: 100.0000 mg | ORAL_TABLET | Freq: Every day | ORAL | 0 refills | Status: DC | PRN

## 2019-12-20 MED ORDER — LISDEXAMFETAMINE DIMESYLATE 70 MG PO CAPS: 70.0000 mg | ORAL_CAPSULE | Freq: Every day | ORAL | 0 refills | Status: DC

## 2019-12-20 MED ORDER — CLONAZEPAM 0.5 MG PO TABS: ORAL_TABLET | ORAL | 0 refills | Status: DC

## 2019-12-20 NOTE — Progress Notes (Signed)
CHISTOPHER Gallegos [id number] 1955-07-15 64 y.o.     Subjective:   Patient ID:  Wyatt Gallegos is a 64 y.o. (DOB 09-Nov-1955) male.  Chief Complaint:  Chief Complaint  Patient presents with  . Follow-up  . ADHD  . Anxiety    Anxiety Symptoms include nervous/anxious behavior. Patient reports no confusion, decreased concentration, palpitations or suicidal ideas.     [name] presents today for follow-up of depression, anxiety, and ADD HD.  He has been under my care for many years.  At visit May 2020 and was fatigue and we reduced Lexapro to help.  It did not help so we switched to duloxetine  40 and it helped.  He has some chronic anxiety and was taking clonazepam at night and half of a clonazepam tablet in the morning for sleep and anxiety respectively   08/31/19 appt with the following noted: On 40 mg duloxetine, Vyvanse 70, clonazepam 0.5 mg prn. Took [employer] job but too far in [place].  [employer] next job unloading trucks.  Not consistent hours so poor pay.   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  Then on with [employer] in [place] but fired in 2 days bc he wasn't fast enough. 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  Has a potential at [employer]. Trying to stay positive. Overall really pleased.  Still pleased with duloxetine and feels like his normal self.  Patient reports stable mood and denies depressed or irritable moods.  Patient denies any recent difficulty with anxiety.  Patient denies difficulty with sleep initiation or maintenance. Denies appetite disturbance.  Patient reports that energy and motivation have been good.  Patient denies any difficulty with concentration.  Patient denies any suicidal ideation. Very tense if runs out of clonazepam. Still doing counseling.  Exercising 3 times weekly. Clonazepam really helped take the edge off anxiety during the day and could focus better  bc less anxiety.  Taking 0.5 mg AM and 1.0 mg HS.  Keeps anxiety from paralyzing him.  Getting sleep.  Wants to take it twice daily.  Sleep well with Klonopin. Still benefit with Vyvanse for focus. Plan: no med changes  10/22/2019 telephone call from patient requesting early refill by 2 weeks on clonazepam that was refused.  Patient was warned not to exceed recommended dosing on controlled substance or it would be discontinued.  12/20/19 appt with following noted: PDMP reviewed and shows Vyvanse 70 mg filled on a regular schedule.  The last clonazepam 0.5 mg tablets was filled on 11/07/2019 420 tablets. Says he lost the clonazepam RX.  Was in a lot of social turmoil and unrest in living situation.  Says 4 weeks later he found it.  Denies he was overtaking the med.   Situation with exW upsetting bc he feels she left him over money.  Still grief though has done grief work in grief share.  Recognizes chronic job instability. Just got suspended from [employer] working 15 hours days.   Over trivial matter of being falsely accused of sexual harassment.  Works hard all day.   He called HR and they said they are investigating it.   Feels he needs the clonazepam and the Vyvanse and benefits from duloxetine.   Still going to counseling.  Denies her alcohol or caffeine excess.  Past Psychiatric Medication Trials: Prozac, Zoloft, Lexapro fatigue. Wellbutrin 450, Duloxetine 40, Buspar,  Concerta, Ritalin, Provigil, Vyvanse 70,  modafinil,   Clonazepam 0.5 mg 4 tablets daily  Review of Systems:  Review of Systems  Constitutional: Negative for fatigue.  Cardiovascular: Negative for palpitations.  Genitourinary:       ED  Musculoskeletal: Positive for arthralgias and myalgias.  Neurological: Negative for tremors and weakness.  Psychiatric/Behavioral: Negative for agitation, behavioral problems, confusion, decreased concentration, dysphoric mood, hallucinations, self-injury, sleep disturbance and suicidal  ideas. The patient is nervous/anxious. The patient is not hyperactive.     Medications: I have reviewed the patient's current medications.  Current Outpatient Medications  Medication Sig Dispense Refill  . DULoxetine (CYMBALTA) 20 MG capsule Take 2 capsules (40 mg total) by mouth daily. 180 capsule 1  . meloxicam (MOBIC) 15 MG tablet Take 15 mg by mouth daily.  0  . metoprolol tartrate (LOPRESSOR) 25 MG tablet Take 25 mg by mouth daily.    . tadalafil (CIALIS) 5 MG tablet Take by mouth.    . tamsulosin (FLOMAX) 0.4 MG CAPS capsule Take 0.4 mg by mouth daily.  0  . testosterone cypionate (DEPOTESTOSTERONE CYPIONATE) 200 MG/ML injection INJECT INTO THE MUSCLE EVERY 14 DAYS    . clonazePAM (KLONOPIN) 0.5 MG tablet TAKE 1 TABLET BY MOUTH IN THE MORNING, 1 TABLET IN THE AFTERNOON IF NEEDED FOR ANXIETY AND TWO TABLETS AT NIGHT 100 tablet 0  . lisdexamfetamine (VYVANSE) 70 MG capsule Take 1 capsule (70 mg total) by mouth daily. 30 capsule 0  . [START ON 01/17/2020] lisdexamfetamine (VYVANSE) 70 MG capsule Take 1 capsule (70 mg total) by mouth daily. 30 capsule 0  . [START ON 02/14/2020] lisdexamfetamine (VYVANSE) 70 MG capsule Take 1 capsule (70 mg total) by mouth daily. 30 capsule 0  . sildenafil (VIAGRA) 100 MG tablet Take 1 tablet (100 mg total) by mouth daily as needed for erectile dysfunction. 10 tablet 0   No current facility-administered medications for this visit.    Medication Side Effects: ? ED related  Allergies: No Known Allergies  History reviewed. No pertinent past medical history.  History reviewed. No pertinent family history.  Social History   Socioeconomic History  . Marital status: Married    Spouse name: Not on file  . Number of children: Not on file  . Years of education: Not on file  . Highest education level: Not on file  Occupational History  . Not on file  Tobacco Use  . Smoking status: Never Smoker  . Smokeless tobacco: Never Used  Substance and Sexual  Activity  . Alcohol use: Never  . Drug use: Never  . Sexual activity: Not on file  Other Topics Concern  . Not on file  Social History Narrative  . Not on file   Social Determinants of Health   Financial Resource Strain: Not on file  Food Insecurity: Not on file  Transportation Needs: Not on file  Physical Activity: Not on file  Stress: Not on file  Social Connections: Not on file  Intimate Partner Violence: Not on file    Past Medical History, Surgical history, Social history, and Family history were reviewed and updated as appropriate.   Please see review of systems for further details on the patient's review from today.   Objective:   Physical Exam:  There were no vitals taken for this visit.  Physical Exam Constitutional:      General: He is not in acute distress. Musculoskeletal:        General: No deformity.  Neurological:     Mental Status: He is  alert and oriented to person, place, and time.     Cranial Nerves: No dysarthria.     Coordination: Coordination normal.  Psychiatric:        Attention and Perception: Perception normal. He is inattentive. He does not perceive auditory or visual hallucinations.        Mood and Affect: Mood normal. Mood is not anxious or depressed. Affect is not labile, blunt, angry or inappropriate.        Speech: Speech normal.        Behavior: Behavior normal. Behavior is cooperative.        Thought Content: Thought content normal. Thought content is not paranoid or delusional. Thought content does not include homicidal or suicidal ideation. Thought content does not include homicidal or suicidal plan.        Cognition and Memory: Cognition and memory normal.        Judgment: Judgment normal.     Comments: Insight good. A little forgetful about med compliance.     Lab Review:     Component Value Date/Time   NA 136 07/04/2017 1551   K 3.8 07/04/2017 1551   CL 101 07/04/2017 1551   CO2 23 07/04/2017 1551   GLUCOSE 108 (H)  07/04/2017 1551   BUN 20 07/04/2017 1551   CREATININE 1.11 07/04/2017 1551   CALCIUM 9.5 07/04/2017 1551   GFRNONAA >60 07/04/2017 1551   GFRAA >60 07/04/2017 1551       Component Value Date/Time   WBC 6.5 07/04/2017 1551   RBC 5.33 07/04/2017 1551   HGB 16.3 07/04/2017 1551   HCT 48.5 07/04/2017 1551   PLT 205 07/04/2017 1551   MCV 91.1 07/04/2017 1551   MCH 30.6 07/04/2017 1551   MCHC 33.6 07/04/2017 1551   RDW 14.2 07/04/2017 1551    No results found for: POCLITH, LITHIUM   No results found for: PHENYTOIN, PHENOBARB, VALPROATE, CBMZ   .res Assessment: Plan:   Molly MaduroRobert was seen today for follow-up, adhd and anxiety.  Diagnoses and all orders for this visit:  Depression, major, recurrent, in partial remission (HCC) -     clonazePAM (KLONOPIN) 0.5 MG tablet; TAKE 1 TABLET BY MOUTH IN THE MORNING, 1 TABLET IN THE AFTERNOON IF NEEDED FOR ANXIETY AND TWO TABLETS AT NIGHT  Panic disorder with agoraphobia -     clonazePAM (KLONOPIN) 0.5 MG tablet; TAKE 1 TABLET BY MOUTH IN THE MORNING, 1 TABLET IN THE AFTERNOON IF NEEDED FOR ANXIETY AND TWO TABLETS AT NIGHT  Attention deficit hyperactivity disorder (ADHD), predominantly inattentive type -     lisdexamfetamine (VYVANSE) 70 MG capsule; Take 1 capsule (70 mg total) by mouth daily. -     lisdexamfetamine (VYVANSE) 70 MG capsule; Take 1 capsule (70 mg total) by mouth daily. -     lisdexamfetamine (VYVANSE) 70 MG capsule; Take 1 capsule (70 mg total) by mouth daily.  Generalized anxiety disorder -     clonazePAM (KLONOPIN) 0.5 MG tablet; TAKE 1 TABLET BY MOUTH IN THE MORNING, 1 TABLET IN THE AFTERNOON IF NEEDED FOR ANXIETY AND TWO TABLETS AT NIGHT  Insomnia due to mental condition  Erectile disorder, acquired, situational, severe -     sildenafil (VIAGRA) 100 MG tablet; Take 1 tablet (100 mg total) by mouth daily as needed for erectile dysfunction.   Supportive therapy dealing with work stress.  He's continued to have a lot of  job instability which is a chronic stressor. Positive thinking.  Take advantage of faith resources.  Still mains good maintains good response with regard to depression and anxiety with the switch from Lexapro and Wellbutrin to duloxetine 40 mg daily. Less anxiety and fatigue with duloxetine 40 mg. .  However he still needs the clonazepam as noted 0.5 mg tablets 1 every morning and 2 nightly for sleep and anxiety.  He is not ideal that he is taking clonazepam in the morning along with Vyvanse but his anxiety has been debilitating at times and has interfered with his ability to function and work settings and maintain employment.  Have not been able to manage the anxiety by usual alternative preferred methods having failed multiple SSRIs.  Discussed potential benefits, risks, and side effects of stimulants with patient to include increased heart rate, palpitations, insomnia, increased anxiety, increased irritability, or decreased appetite.  Instructed patient to contact office if experiencing any significant tolerability issues. Still has sig ADD sx remaining and consider off label memantine.  We discussed the short-term risks associated with benzodiazepines including sedation and increased fall risk among others.  Discussed long-term side effect risk including dependence, potential withdrawal symptoms, and the potential eventual dose-related risk of dementia.   Disc risk dependence.  Not ideal to use Bz with stimulants but he's struggled with function without it.  Disc ED and tx. has had problems with both Viagra and Cialis.  Suggest he discuss this with urologist.  Both citalopram and potentially metoprolol could be contributing to some of his ED problems.  He wants to retry Viagra.  Sent  Disc handling HR complaint.  No med changes. Continue duloxetine 40, Vyvanse 70, clonazepam 0.5 mg BID and 1.0 mg HS,  FU 4 mos  Meredith Staggers, MD, DFAPA    Please see After Visit Summary for patient  specific instructions.  No future appointments.  No orders of the defined types were placed in this encounter.     -------------------------------

## 2019-12-26 ENCOUNTER — Other Ambulatory Visit: Payer: Self-pay | Admitting: Psychiatry

## 2019-12-26 DIAGNOSIS — N529 Male erectile dysfunction, unspecified: Secondary | ICD-10-CM

## 2020-01-11 ENCOUNTER — Other Ambulatory Visit: Payer: Self-pay | Admitting: Psychiatry

## 2020-01-11 DIAGNOSIS — N529 Male erectile dysfunction, unspecified: Secondary | ICD-10-CM

## 2020-01-12 ENCOUNTER — Telehealth: Payer: Self-pay | Admitting: Psychiatry

## 2020-01-12 ENCOUNTER — Other Ambulatory Visit: Payer: Self-pay | Admitting: Psychiatry

## 2020-01-12 DIAGNOSIS — F900 Attention-deficit hyperactivity disorder, predominantly inattentive type: Secondary | ICD-10-CM

## 2020-01-12 MED ORDER — LISDEXAMFETAMINE DIMESYLATE 70 MG PO CAPS: 70.0000 mg | ORAL_CAPSULE | Freq: Every day | ORAL | 0 refills | Status: DC

## 2020-01-12 NOTE — Telephone Encounter (Signed)
Next appt is 03/30/20. Requesting a refill for Vyvanse 70 mg. [facility] on [address], [place], [place]. Phone # is [phone].

## 2020-02-11 ENCOUNTER — Telehealth: Payer: Self-pay | Admitting: Psychiatry

## 2020-02-11 NOTE — Telephone Encounter (Signed)
Pt called to report all Rx should now go to new Pharmacy [facility] [address]. Requesting Vyvanse Rx send to new Pharmacy for March, April, & May. Apt 3/24

## 2020-03-08 ENCOUNTER — Other Ambulatory Visit: Payer: Self-pay

## 2020-03-08 DIAGNOSIS — F900 Attention-deficit hyperactivity disorder, predominantly inattentive type: Secondary | ICD-10-CM

## 2020-03-08 MED ORDER — LISDEXAMFETAMINE DIMESYLATE 70 MG PO CAPS: 70.0000 mg | ORAL_CAPSULE | Freq: Every day | ORAL | 0 refills | Status: DC

## 2020-03-08 NOTE — Telephone Encounter (Signed)
Last refill 02/14/20 but only for #23 for some reason.   Has apt end of March   Pended Rx for Dr. Jennelle Human to send

## 2020-03-15 ENCOUNTER — Telehealth: Payer: Self-pay | Admitting: Psychiatry

## 2020-03-15 ENCOUNTER — Telehealth: Payer: Self-pay

## 2020-03-15 ENCOUNTER — Other Ambulatory Visit: Payer: Self-pay | Admitting: Psychiatry

## 2020-03-15 DIAGNOSIS — F411 Generalized anxiety disorder: Secondary | ICD-10-CM

## 2020-03-15 DIAGNOSIS — F4001 Agoraphobia with panic disorder: Secondary | ICD-10-CM

## 2020-03-15 DIAGNOSIS — F3341 Major depressive disorder, recurrent, in partial remission: Secondary | ICD-10-CM

## 2020-03-15 NOTE — Telephone Encounter (Signed)
Patient called in today concerning his prescription for Vyvanse, he states that he has been out for awhile. He states that there has been correspondence between Dr Jennelle Human and the company who makes the Vyvanse. He would like some insight on exactly what is going on. He would like if possible for his refill to go to [facility] on [address] in [place], [place]

## 2020-03-15 NOTE — Telephone Encounter (Signed)
Prior Authorization submitted and approved for VYVANSE 70 MG effective 03/13/2020-03/13/2021. [id number] With [facility]

## 2020-03-15 NOTE — Telephone Encounter (Signed)
I'm not exactly sure what he's referring to other then a prior authorization for Vyvanse. He does have an approval effective 03/13/2020. Tried to reach pharmacy but tried several times and an employee at the customer service answered and informed me the pharmacy was really busy and having an issue with a customer. Will try to reach them in the morning to make sure they have a Rx on file.

## 2020-03-15 NOTE — Telephone Encounter (Signed)
Please review

## 2020-03-16 NOTE — Telephone Encounter (Signed)
Pharmacy confirmed Pt has picked up Rx.

## 2020-03-16 NOTE — Telephone Encounter (Signed)
Sheralyn Boatman, can you check with [facility] on his Vyvanse. Looks like it was ordered for fill 03/02 or after and a PA has been approved. Not sure what the pt needs

## 2020-03-16 NOTE — Telephone Encounter (Signed)
Thanks so much. 

## 2020-03-17 ENCOUNTER — Other Ambulatory Visit: Payer: Self-pay | Admitting: Psychiatry

## 2020-03-17 DIAGNOSIS — F4001 Agoraphobia with panic disorder: Secondary | ICD-10-CM

## 2020-03-17 DIAGNOSIS — F411 Generalized anxiety disorder: Secondary | ICD-10-CM

## 2020-03-17 DIAGNOSIS — F3341 Major depressive disorder, recurrent, in partial remission: Secondary | ICD-10-CM

## 2020-03-30 ENCOUNTER — Ambulatory Visit: Payer: Self-pay | Admitting: Psychiatry

## 2020-03-31 ENCOUNTER — Other Ambulatory Visit: Payer: Self-pay | Admitting: Psychiatry

## 2020-03-31 DIAGNOSIS — F411 Generalized anxiety disorder: Secondary | ICD-10-CM

## 2020-03-31 DIAGNOSIS — F4001 Agoraphobia with panic disorder: Secondary | ICD-10-CM

## 2020-03-31 DIAGNOSIS — F3341 Major depressive disorder, recurrent, in partial remission: Secondary | ICD-10-CM

## 2020-04-01 NOTE — Telephone Encounter (Signed)
Apt Monday 3/28

## 2020-04-03 ENCOUNTER — Ambulatory Visit (INDEPENDENT_AMBULATORY_CARE_PROVIDER_SITE_OTHER): Payer: Self-pay | Admitting: Psychiatry

## 2020-04-03 ENCOUNTER — Other Ambulatory Visit: Payer: Self-pay

## 2020-04-03 ENCOUNTER — Encounter: Payer: Self-pay | Admitting: Psychiatry

## 2020-04-03 DIAGNOSIS — F411 Generalized anxiety disorder: Secondary | ICD-10-CM

## 2020-04-03 DIAGNOSIS — N529 Male erectile dysfunction, unspecified: Secondary | ICD-10-CM

## 2020-04-03 DIAGNOSIS — F3341 Major depressive disorder, recurrent, in partial remission: Secondary | ICD-10-CM

## 2020-04-03 DIAGNOSIS — F900 Attention-deficit hyperactivity disorder, predominantly inattentive type: Secondary | ICD-10-CM

## 2020-04-03 DIAGNOSIS — F5105 Insomnia due to other mental disorder: Secondary | ICD-10-CM

## 2020-04-03 DIAGNOSIS — F4001 Agoraphobia with panic disorder: Secondary | ICD-10-CM

## 2020-04-03 MED ORDER — LISDEXAMFETAMINE DIMESYLATE 70 MG PO CAPS
70.0000 mg | ORAL_CAPSULE | Freq: Every day | ORAL | 0 refills | Status: DC
Start: 2020-05-29 — End: 2020-08-21

## 2020-04-03 MED ORDER — LISDEXAMFETAMINE DIMESYLATE 70 MG PO CAPS: 70.0000 mg | ORAL_CAPSULE | Freq: Every day | ORAL | 0 refills | Status: DC

## 2020-04-03 MED ORDER — DULOXETINE HCL 20 MG PO CPEP: 40.0000 mg | ORAL_CAPSULE | Freq: Every day | ORAL | 5 refills | Status: DC

## 2020-04-03 MED ORDER — CLONAZEPAM 0.5 MG PO TABS: ORAL_TABLET | ORAL | 2 refills | Status: DC

## 2020-04-03 NOTE — Progress Notes (Signed)
Wyatt AndaRobert L Bobst [id number] 01-07-1956 65 y.o.     Subjective:   Patient ID:  Wyatt Gallegos is a 65 y.o. (DOB 01-07-1956) male.  Chief Complaint:  Chief Complaint  Patient presents with  . Follow-up  . ADHD  . Anxiety    Anxiety Symptoms include nervous/anxious behavior. Patient reports no confusion, decreased concentration, palpitations or suicidal ideas.     Wyatt Gallegos presents today for follow-up of depression, anxiety, and ADD HD.  He has been under my care for many years.  At visit May 2020 and was fatigue and we reduced Lexapro to help.  It did not help so we switched to duloxetine  40 and it helped.  He has some chronic anxiety and was taking clonazepam at night and half of a clonazepam tablet in the morning for sleep and anxiety respectively   08/31/19 appt with the following noted: On 40 mg duloxetine, Vyvanse 70, clonazepam 0.5 mg prn. Took [employer] job but too far in [place].  [employer] next job unloading trucks.  Not consistent hours so poor pay.   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  Then on with [employer] in [place] but fired in 2 days bc he wasn't fast enough. 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  Has a potential at [employer]. Trying to stay positive. Overall really pleased.  Still pleased with duloxetine and feels like his normal self.  Patient reports stable mood and denies depressed or irritable moods.  Patient denies any recent difficulty with anxiety.  Patient denies difficulty with sleep initiation or maintenance. Denies appetite disturbance.  Patient reports that energy and motivation have been good.  Patient denies any difficulty with concentration.  Patient denies any suicidal ideation. Very tense if runs out of clonazepam. Still doing counseling.  Exercising 3 times weekly. Clonazepam really helped take the edge off anxiety during the day and could focus better  bc less anxiety.  Taking 0.5 mg AM and 1.0 mg HS.  Keeps anxiety from paralyzing him.  Getting sleep.  Wants to take it twice daily.  Sleep well with Klonopin. Still benefit with Vyvanse for focus. Plan: no med changes  10/22/2019 telephone call from patient requesting early refill by 2 weeks on clonazepam that was refused.  Patient was warned not to exceed recommended dosing on controlled substance or it would be discontinued.  12/20/19 appt with following noted: PDMP reviewed and shows Vyvanse 70 mg filled on a regular schedule.  The last clonazepam 0.5 mg tablets was filled on 11/07/2019 420 tablets. Says he lost the clonazepam RX.  Was in a lot of social turmoil and unrest in living situation.  Says 4 weeks later he found it.  Denies he was overtaking the med.   Situation with exW upsetting bc he feels she left him over money.  Still grief though has done grief work in grief share.  Recognizes chronic job instability. Just got suspended from [employer] working 15 hours days.   Over trivial matter of being falsely accused of sexual harassment.  Works hard all day.   He called HR and they said they are investigating it.   Feels he needs the clonazepam and the Vyvanse and benefits from duloxetine. Plan: No med changes. Continue duloxetine 40, Vyvanse 70, clonazepam 0.5 mg BID and 1.0 mg HS,   04/03/2020 appointment with following noted: D accepted to [employer] [facility]. New job better  pay.    Getting Vyvanse through the program.   $ stress with [employer] cutting back hours.  Was hard on his R knee.  Better without the job.  Still going to counseling.  Denies her alcohol or caffeine excess.  Past Psychiatric Medication Trials: Prozac, Zoloft, Lexapro fatigue. Wellbutrin 450, Duloxetine 40, Buspar,  Concerta, Ritalin, Provigil, Vyvanse 70, modafinil,   Clonazepam 0.5 mg 4 tablets daily  Review of Systems:  Review of Systems  Constitutional: Negative for fatigue.  Cardiovascular: Negative for  palpitations.  Genitourinary: Positive for difficulty urinating. Negative for decreased urine volume.       ED  Musculoskeletal: Positive for arthralgias and myalgias.  Neurological: Negative for tremors and weakness.  Psychiatric/Behavioral: Negative for agitation, behavioral problems, confusion, decreased concentration, dysphoric mood, hallucinations, self-injury, sleep disturbance and suicidal ideas. The patient is nervous/anxious. The patient is not hyperactive.     Medications: I have reviewed the patient's current medications.  Current Outpatient Medications  Medication Sig Dispense Refill  . meloxicam (MOBIC) 15 MG tablet Take 15 mg by mouth daily.  0  . metoprolol tartrate (LOPRESSOR) 25 MG tablet Take 25 mg by mouth daily.    . sildenafil (VIAGRA) 100 MG tablet TAKE ONE TABLET BY MOUTH DAILY AS NEEDED FOR ERECTILE DYSFUNCTION 30 tablet 0  . tadalafil (CIALIS) 5 MG tablet Take by mouth.    . tamsulosin (FLOMAX) 0.4 MG CAPS capsule Take 0.4 mg by mouth daily.  0  . testosterone cypionate (DEPOTESTOSTERONE CYPIONATE) 200 MG/ML injection INJECT INTO THE MUSCLE EVERY 14 DAYS    . clonazePAM (KLONOPIN) 0.5 MG tablet TAKE ONE TABLET BY MOUTH IN THE MORNING, ONE TABLET BY MOUTH IN THE AFTERNOON, AND TWO TABLETS BY MOUTH AT NIGHT IF NEEDED FOR ANXIETY ,NO EARLY REFILLS 120 tablet 2  . DULoxetine (CYMBALTA) 20 MG capsule Take 2 capsules (40 mg total) by mouth daily. 60 capsule 5  . lisdexamfetamine (VYVANSE) 70 MG capsule Take 1 capsule (70 mg total) by mouth daily. 30 capsule 0  . [START ON 05/01/2020] lisdexamfetamine (VYVANSE) 70 MG capsule Take 1 capsule (70 mg total) by mouth daily. 30 capsule 0  . [START ON 05/29/2020] lisdexamfetamine (VYVANSE) 70 MG capsule Take 1 capsule (70 mg total) by mouth daily. 30 capsule 0   No current facility-administered medications for this visit.    Medication Side Effects: ? ED related  Allergies: No Known Allergies  History reviewed. No  pertinent past medical history.  History reviewed. No pertinent family history.  Social History   Socioeconomic History  . Marital status: Married    Spouse name: Not on file  . Number of children: Not on file  . Years of education: Not on file  . Highest education level: Not on file  Occupational History  . Not on file  Tobacco Use  . Smoking status: Never Smoker  . Smokeless tobacco: Never Used  Substance and Sexual Activity  . Alcohol use: Never  . Drug use: Never  . Sexual activity: Not on file  Other Topics Concern  . Not on file  Social History Narrative  . Not on file   Social Determinants of Health   Financial Resource Strain: Not on file  Food Insecurity: Not on file  Transportation Needs: Not on file  Physical Activity: Not on file  Stress: Not on file  Social Connections: Not on file  Intimate Partner Violence: Not on file    Past Medical History, Surgical history, Social history, and Family history  were reviewed and updated as appropriate.   Please see review of systems for further details on the patient's review from today.   Objective:   Physical Exam:  There were no vitals taken for this visit.  Physical Exam Constitutional:      General: He is not in acute distress. Musculoskeletal:        General: No deformity.  Neurological:     Mental Status: He is alert and oriented to person, place, and time.     Cranial Nerves: No dysarthria.     Coordination: Coordination normal.  Psychiatric:        Attention and Perception: Perception normal. He is inattentive. He does not perceive auditory or visual hallucinations.        Mood and Affect: Mood is anxious. Mood is not depressed. Affect is not labile, blunt, angry or inappropriate.        Speech: Speech normal.        Behavior: Behavior normal. Behavior is cooperative.        Thought Content: Thought content normal. Thought content is not paranoid or delusional. Thought content does not include  homicidal or suicidal ideation. Thought content does not include homicidal or suicidal plan.        Cognition and Memory: Cognition and memory normal.        Judgment: Judgment normal.     Comments: Insight good. Some chronic stress and anxiety     Lab Review:     Component Value Date/Time   NA 136 07/04/2017 1551   K 3.8 07/04/2017 1551   CL 101 07/04/2017 1551   CO2 23 07/04/2017 1551   GLUCOSE 108 (H) 07/04/2017 1551   BUN 20 07/04/2017 1551   CREATININE 1.11 07/04/2017 1551   CALCIUM 9.5 07/04/2017 1551   GFRNONAA >60 07/04/2017 1551   GFRAA >60 07/04/2017 1551       Component Value Date/Time   WBC 6.5 07/04/2017 1551   RBC 5.33 07/04/2017 1551   HGB 16.3 07/04/2017 1551   HCT 48.5 07/04/2017 1551   PLT 205 07/04/2017 1551   MCV 91.1 07/04/2017 1551   MCH 30.6 07/04/2017 1551   MCHC 33.6 07/04/2017 1551   RDW 14.2 07/04/2017 1551    No results found for: POCLITH, LITHIUM   No results found for: PHENYTOIN, PHENOBARB, VALPROATE, CBMZ   .res Assessment: Plan:   Wyatt Gallegos was seen today for follow-up, adhd and anxiety.  Diagnoses and all orders for this visit:  Attention deficit hyperactivity disorder (ADHD), predominantly inattentive type -     lisdexamfetamine (VYVANSE) 70 MG capsule; Take 1 capsule (70 mg total) by mouth daily. -     lisdexamfetamine (VYVANSE) 70 MG capsule; Take 1 capsule (70 mg total) by mouth daily. -     lisdexamfetamine (VYVANSE) 70 MG capsule; Take 1 capsule (70 mg total) by mouth daily.  Depression, major, recurrent, in partial remission (HCC) -     clonazePAM (KLONOPIN) 0.5 MG tablet; TAKE ONE TABLET BY MOUTH IN THE MORNING, ONE TABLET BY MOUTH IN THE AFTERNOON, AND TWO TABLETS BY MOUTH AT NIGHT IF NEEDED FOR ANXIETY ,NO EARLY REFILLS -     DULoxetine (CYMBALTA) 20 MG capsule; Take 2 capsules (40 mg total) by mouth daily.  Panic disorder with agoraphobia -     clonazePAM (KLONOPIN) 0.5 MG tablet; TAKE ONE TABLET BY MOUTH IN THE MORNING,  ONE TABLET BY MOUTH IN THE AFTERNOON, AND TWO TABLETS BY MOUTH AT NIGHT IF NEEDED FOR ANXIETY ,NO  EARLY REFILLS -     DULoxetine (CYMBALTA) 20 MG capsule; Take 2 capsules (40 mg total) by mouth daily.  Generalized anxiety disorder -     clonazePAM (KLONOPIN) 0.5 MG tablet; TAKE ONE TABLET BY MOUTH IN THE MORNING, ONE TABLET BY MOUTH IN THE AFTERNOON, AND TWO TABLETS BY MOUTH AT NIGHT IF NEEDED FOR ANXIETY ,NO EARLY REFILLS -     DULoxetine (CYMBALTA) 20 MG capsule; Take 2 capsules (40 mg total) by mouth daily.  Insomnia due to mental condition  Erectile disorder, acquired, situational, severe   Supportive therapy dealing with work stress.  He's continued to have a lot of job instability which is a chronic stressor. Take advantage of faith resources.  Still mains good maintains good response with regard to depression and anxiety with the switch from Lexapro and Wellbutrin to duloxetine 40 mg daily. Less anxiety and fatigue with duloxetine 40 mg overall but chronically stressed.    However he still needs the clonazepam as noted 0.5 mg tablets 1 every morning and 2 nightly for sleep and anxiety.  He is not ideal that he is taking clonazepam in the morning along with Vyvanse but his anxiety has been debilitating at times and has interfered with his ability to function and work settings and maintain employment.  Have not been able to manage the anxiety by usual alternative preferred methods having failed multiple SSRIs.  Discussed potential benefits, risks, and side effects of stimulants with patient to include increased heart rate, palpitations, insomnia, increased anxiety, increased irritability, or decreased appetite.  Instructed patient to contact office if experiencing any significant tolerability issues. Still has sig ADD sx remaining and consider off label memantine.  We discussed the short-term risks associated with benzodiazepines including sedation and increased fall risk among others.   Discussed long-term side effect risk including dependence, potential withdrawal symptoms, and the potential eventual dose-related risk of dementia.   Disc risk dependence.  Not ideal to use Bz with stimulants but he's struggled with function without it.  Disc ED and tx. has had problems with both Viagra and Cialis.  Suggest he discuss this with urologist.  Both citalopram and potentially metoprolol could be contributing to some of his ED problems.  He wants to retry Viagra.  Sent  Disc chronic stress with $ and difficulty keeping a job  No med changes. Continue duloxetine 40, Vyvanse 70, clonazepam 0.5 mg BID and 1.0 mg HS,  FU 4 mos  Meredith Staggers, MD, DFAPA    Please see After Visit Summary for patient specific instructions.  No future appointments.  No orders of the defined types were placed in this encounter.     -------------------------------

## 2020-04-17 ENCOUNTER — Telehealth: Payer: Self-pay | Admitting: Psychiatry

## 2020-04-17 ENCOUNTER — Other Ambulatory Visit: Payer: Self-pay | Admitting: Psychiatry

## 2020-04-17 MED ORDER — AMPHETAMINE-DEXTROAMPHET ER 30 MG PO CP24: 30.0000 mg | ORAL_CAPSULE | Freq: Every day | ORAL | 0 refills | Status: DC

## 2020-04-17 NOTE — Telephone Encounter (Signed)
Wyatt Gallegos called because he applying for re-enrollment in the Pt. Assistance program for his Vyvanse.  They have told him it could be 6-8 weeks before they review/approve/send the medication.  He is out of the Vyvanse now.  He is asking if you could prescribe the Adderall 24 hr capsule to tie him over until the Vyvanse is approved by pt. Assistance.  His pharmacist told him the Adderall is not expensive and it will help him until he can restart the vyvanse.

## 2020-04-17 NOTE — Telephone Encounter (Signed)
Do you know about the status of his patient assistance?  I will send in the Adderall XR

## 2020-04-17 NOTE — Telephone Encounter (Signed)
Please review

## 2020-07-12 ENCOUNTER — Other Ambulatory Visit: Payer: Self-pay | Admitting: Psychiatry

## 2020-07-12 DIAGNOSIS — F3341 Major depressive disorder, recurrent, in partial remission: Secondary | ICD-10-CM

## 2020-07-12 DIAGNOSIS — F411 Generalized anxiety disorder: Secondary | ICD-10-CM

## 2020-07-12 DIAGNOSIS — F4001 Agoraphobia with panic disorder: Secondary | ICD-10-CM

## 2020-07-24 ENCOUNTER — Other Ambulatory Visit: Payer: Self-pay | Admitting: Psychiatry

## 2020-07-24 ENCOUNTER — Telehealth: Payer: Self-pay | Admitting: Psychiatry

## 2020-07-24 DIAGNOSIS — F900 Attention-deficit hyperactivity disorder, predominantly inattentive type: Secondary | ICD-10-CM

## 2020-07-24 NOTE — Telephone Encounter (Signed)
Pended.

## 2020-07-24 NOTE — Telephone Encounter (Signed)
Pt (wife) Wyatt Gallegos called in for refill on Vyvanse 70mg . Appt 8/4. Pharmacy [facility] [address] at [facility] [address] [place], [place]

## 2020-07-25 MED ORDER — LISDEXAMFETAMINE DIMESYLATE 70 MG PO CAPS
70.0000 mg | ORAL_CAPSULE | Freq: Every day | ORAL | 0 refills | Status: DC
Start: 2020-07-25 — End: 2020-08-21

## 2020-08-10 ENCOUNTER — Ambulatory Visit: Payer: Self-pay | Admitting: Psychiatry

## 2020-08-21 ENCOUNTER — Encounter: Payer: Self-pay | Admitting: Psychiatry

## 2020-08-21 ENCOUNTER — Other Ambulatory Visit: Payer: Self-pay

## 2020-08-21 ENCOUNTER — Ambulatory Visit (INDEPENDENT_AMBULATORY_CARE_PROVIDER_SITE_OTHER): Payer: Self-pay | Admitting: Psychiatry

## 2020-08-21 VITALS — BP 153/104 | HR 85

## 2020-08-21 DIAGNOSIS — F5105 Insomnia due to other mental disorder: Secondary | ICD-10-CM

## 2020-08-21 DIAGNOSIS — F411 Generalized anxiety disorder: Secondary | ICD-10-CM

## 2020-08-21 DIAGNOSIS — F900 Attention-deficit hyperactivity disorder, predominantly inattentive type: Secondary | ICD-10-CM

## 2020-08-21 DIAGNOSIS — F3341 Major depressive disorder, recurrent, in partial remission: Secondary | ICD-10-CM

## 2020-08-21 DIAGNOSIS — F4001 Agoraphobia with panic disorder: Secondary | ICD-10-CM

## 2020-08-21 MED ORDER — LISDEXAMFETAMINE DIMESYLATE 70 MG PO CAPS: 70.0000 mg | ORAL_CAPSULE | Freq: Every day | ORAL | 0 refills | Status: DC

## 2020-08-21 MED ORDER — METOPROLOL TARTRATE 25 MG PO TABS: 25.0000 mg | ORAL_TABLET | Freq: Every day | ORAL | 0 refills | Status: DC

## 2020-08-21 MED ORDER — DULOXETINE HCL 20 MG PO CPEP: 40.0000 mg | ORAL_CAPSULE | Freq: Every day | ORAL | 5 refills | Status: DC

## 2020-08-21 NOTE — Progress Notes (Signed)
Wyatt Gallegos [id number] [birth year] 65 y.o.     Subjective:   Patient ID:  Wyatt Gallegos is a 65 y.o. (DOB Jul 06, 1955) male.  Chief Complaint:  Chief Complaint  Patient presents with   Follow-up   Depression   ADHD   Anxiety    Anxiety Symptoms include nervous/anxious behavior. Patient reports no confusion, decreased concentration, palpitations or suicidal ideas.    Wyatt Gallegos presents today for follow-up of depression, anxiety, and ADD HD.  He has been under my care for many years.  At visit May 2020 and was fatigue and we reduced Lexapro to help.  It did not help so we switched to duloxetine  40 and it helped.  He has some chronic anxiety and was taking clonazepam at night and half of a clonazepam tablet in the morning for sleep and anxiety respectively   08/31/19 appt with the following noted: On 40 mg duloxetine, Vyvanse 70, clonazepam 0.5 mg prn. Took [employer] job but too far in [place].  [employer] next job unloading trucks.  Not consistent hours so poor pay.   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  Then on with [employer] in [place] but fired in 2 days bc he wasn't fast enough. 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  Has a potential at [employer]. Trying to stay positive. Overall really pleased.  Still pleased with duloxetine and feels like his normal self.  Patient reports stable mood and denies depressed or irritable moods.  Patient denies any recent difficulty with anxiety.  Patient denies difficulty with sleep initiation or maintenance. Denies appetite disturbance.  Patient reports that energy and motivation have been good.  Patient denies any difficulty with concentration.  Patient denies any suicidal ideation. Very tense if runs out of clonazepam. Still doing counseling.  Exercising 3 times weekly. Clonazepam really helped take the edge off anxiety during the day and could focus  better bc less anxiety.  Taking 0.5 mg AM and 1.0 mg HS.  Keeps anxiety from paralyzing him.  Getting sleep.  Wants to take it twice daily.  Sleep well with Klonopin. Still benefit with Vyvanse for focus. Plan: no med changes  10/22/2019 telephone call from patient requesting early refill by 2 weeks on clonazepam that was refused.  Patient was warned not to exceed recommended dosing on controlled substance or it would be discontinued.  12/20/19 appt with following noted: PDMP reviewed and shows Vyvanse 70 mg filled on a regular schedule.  The last clonazepam 0.5 mg tablets was filled on 11/07/2019 420 tablets. Says he lost the clonazepam RX.  Was in a lot of social turmoil and unrest in living situation.  Says 4 weeks later he found it.  Denies he was overtaking the med.   Situation with exW upsetting bc he feels she left him over money.  Still grief though has done grief work in grief share.  Recognizes chronic job instability. Just got suspended from [employer] working 15 hours days.   Over trivial matter of being falsely accused of sexual harassment.  Works hard all day.   He called HR and they said they are investigating it.   Feels he needs the clonazepam and the Vyvanse and benefits from duloxetine. Plan: No med changes. 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    Getting Vyvanse through the program.   $ stress with [employer] cutting back hours.  Was hard on his R knee.  Better without the job. Plan no med changes  08/21/20 appt noted: Has remained on Vyvanse with Pt Assistance. Lonely through [place] and feeling sucked into a hole by a woman.  Getting scammed.  Trying to get out of the scam.    Has talked to men at church about it.  While up in [place] to deal with it got pulled over and car impounded for not paying a ticket.  Had to walk 20 miles and hitchhiked.   Nephew helped him out spent $1200 to help pt.  Has asked others for money after giving scammer money.   Still losing and changing jobs. Lost another job and Covid has hurt him and [facility] and lost a month of income and then cleared in investigation.  Still going to counseling.  Denies her alcohol or caffeine excess.  Past Psychiatric Medication Trials: Prozac, Zoloft, Lexapro fatigue. Wellbutrin 450, Duloxetine 40, Buspar,  Concerta, Ritalin, Provigil, Vyvanse 70, modafinil,   Clonazepam 0.5 mg 4 tablets daily  Review of Systems:  Review of Systems  Constitutional:  Negative for fatigue.  Cardiovascular:  Negative for palpitations.  Genitourinary:  Negative for decreased urine volume and difficulty urinating.       ED  Musculoskeletal:  Positive for arthralgias and myalgias.  Neurological:  Negative for tremors and weakness.  Psychiatric/Behavioral:  Positive for behavioral problems. Negative for agitation, confusion, decreased concentration, dysphoric mood, hallucinations, self-injury, sleep disturbance and suicidal ideas. The patient is nervous/anxious. The patient is not hyperactive.    Medications: I have reviewed the patient's current medications.  Current Outpatient Medications  Medication Sig Dispense Refill   clonazePAM (KLONOPIN) 0.5 MG tablet TAKE ONE TABLET BY MOUTH IN THE MORNING, ONE TABLET BY MOUTH IN THE AFTERNOON, AND TAKE TWO TABLETS BY MOUTH AT BEDTIME IF NEEDED FOR ANXIETY (NO EARLY REFILLS) 120 tablet 2   meloxicam (MOBIC) 15 MG tablet Take 15 mg by mouth daily.  0   sildenafil (VIAGRA) 100 MG tablet TAKE ONE TABLET BY MOUTH DAILY AS NEEDED FOR ERECTILE DYSFUNCTION 30 tablet 0   tadalafil (CIALIS) 5 MG tablet Take by mouth.     tamsulosin (FLOMAX) 0.4 MG CAPS capsule Take 0.4 mg by mouth daily.  0   testosterone cypionate (DEPOTESTOSTERONE CYPIONATE) 200 MG/ML injection INJECT 1ML INTO THE MUSCLE EVERY 14 DAYS      amphetamine-dextroamphetamine (ADDERALL XR) 30 MG 24 hr capsule Take 1 capsule (30 mg total) by mouth daily. (Patient not taking: Reported on 08/21/2020) 30 capsule 0   DULoxetine (CYMBALTA) 20 MG capsule Take 2 capsules (40 mg total) by mouth daily. 60 capsule 5   lisdexamfetamine (VYVANSE) 70 MG capsule Take 1 capsule (70 mg total) by mouth daily. 30 capsule 0   [START ON 09/18/2020] lisdexamfetamine (VYVANSE) 70 MG capsule Take 1 capsule (70 mg total) by mouth daily. 30 capsule 0   [START ON 10/16/2020] lisdexamfetamine (VYVANSE) 70 MG capsule Take 1 capsule (70 mg total) by mouth daily. 30 capsule 0   metoprolol tartrate (LOPRESSOR) 25 MG tablet Take 1 tablet (25 mg total) by mouth daily. 90 tablet 0   No current facility-administered medications for this visit.    Medication Side Effects: ? ED related  Allergies: No Known Allergies  History reviewed. No pertinent past medical history.  History reviewed. No pertinent family  history.  Social History   Socioeconomic History   Marital status: Married    Spouse name: Not on file   Number of children: Not on file   Years of education: Not on file   Highest education level: Not on file  Occupational History   Not on file  Tobacco Use   Smoking status: Never   Smokeless tobacco: Never  Substance and Sexual Activity   Alcohol use: Never   Drug use: Never   Sexual activity: Not on file  Other Topics Concern   Not on file  Social History Narrative   Not on file   Social Determinants of Health   Financial Resource Strain: Not on file  Food Insecurity: Not on file  Transportation Needs: Not on file  Physical Activity: Not on file  Stress: Not on file  Social Connections: Not on file  Intimate Partner Violence: Not on file    Past Medical History, Surgical history, Social history, and Family history were reviewed and updated as appropriate.   Please see review of systems for further details on the patient's review from  today.   Objective:   Physical Exam:  BP (!) 153/104   Pulse 85   Physical Exam Constitutional:      General: He is not in acute distress. Musculoskeletal:        General: No deformity.  Neurological:     Mental Status: He is alert and oriented to person, place, and time.     Cranial Nerves: No dysarthria.     Coordination: Coordination normal.  Psychiatric:        Attention and Perception: Perception normal. He is inattentive. He does not perceive auditory or visual hallucinations.        Mood and Affect: Mood is anxious and depressed. Affect is not labile, blunt, angry or inappropriate.        Speech: Speech normal.        Behavior: Behavior normal. Behavior is cooperative.        Thought Content: Thought content normal. Thought content is not paranoid or delusional. Thought content does not include homicidal or suicidal ideation. Thought content does not include homicidal or suicidal plan.        Cognition and Memory: Cognition and memory normal.        Judgment: Judgment is inappropriate.     Comments: Insight good but recent poor judgment. Some chronic stress and anxiety    Lab Review:     Component Value Date/Time   NA 136 07/04/2017 1551   K 3.8 07/04/2017 1551   CL 101 07/04/2017 1551   CO2 23 07/04/2017 1551   GLUCOSE 108 (H) 07/04/2017 1551   BUN 20 07/04/2017 1551   CREATININE 1.11 07/04/2017 1551   CALCIUM 9.5 07/04/2017 1551   GFRNONAA >60 07/04/2017 1551   GFRAA >60 07/04/2017 1551       Component Value Date/Time   WBC 6.5 07/04/2017 1551   RBC 5.33 07/04/2017 1551   HGB 16.3 07/04/2017 1551   HCT 48.5 07/04/2017 1551   PLT 205 07/04/2017 1551   MCV 91.1 07/04/2017 1551   MCH 30.6 07/04/2017 1551   MCHC 33.6 07/04/2017 1551   RDW 14.2 07/04/2017 1551    No results found for: POCLITH, LITHIUM   No results found for: PHENYTOIN, PHENOBARB, VALPROATE, CBMZ   .res Assessment: Plan:   Wyatt Gallegos was seen today for follow-up, depression, adhd and  anxiety.  Diagnoses and all orders for this visit:  Depression,  major, recurrent, in partial remission (HCC) -     DULoxetine (CYMBALTA) 20 MG capsule; Take 2 capsules (40 mg total) by mouth daily.  Attention deficit hyperactivity disorder (ADHD), predominantly inattentive type -     lisdexamfetamine (VYVANSE) 70 MG capsule; Take 1 capsule (70 mg total) by mouth daily. -     lisdexamfetamine (VYVANSE) 70 MG capsule; Take 1 capsule (70 mg total) by mouth daily. -     lisdexamfetamine (VYVANSE) 70 MG capsule; Take 1 capsule (70 mg total) by mouth daily.  Panic disorder with agoraphobia -     DULoxetine (CYMBALTA) 20 MG capsule; Take 2 capsules (40 mg total) by mouth daily.  Generalized anxiety disorder -     DULoxetine (CYMBALTA) 20 MG capsule; Take 2 capsules (40 mg total) by mouth daily.  Insomnia due to mental condition  Other orders -     metoprolol tartrate (LOPRESSOR) 25 MG tablet; Take 1 tablet (25 mg total) by mouth daily.   Still mains good maintains good response with regard to depression and anxiety with the switch from Lexapro and Wellbutrin to duloxetine 40 mg daily. Less anxiety and fatigue with duloxetine 40 mg overall but chronically stressed.    However he still needs the clonazepam as noted 0.5 mg tablets 1 every morning and 2 nightly for sleep and anxiety.  He is not ideal that he is taking clonazepam in the morning along with Vyvanse but his anxiety has been debilitating at times and has interfered with his ability to function and work settings and maintain employment.  Have not been able to manage the anxiety by usual alternative preferred methods having failed multiple SSRIs.  Discussed potential benefits, risks, and side effects of stimulants with patient to include increased heart rate, palpitations, insomnia, increased anxiety, increased irritability, or decreased appetite.  Instructed patient to contact office if experiencing any significant tolerability  issues. Still has sig ADD sx remaining and consider off label memantine.  We discussed the short-term risks associated with benzodiazepines including sedation and increased fall risk among others.  Discussed long-term side effect risk including dependence, potential withdrawal symptoms, and the potential eventual dose-related risk of dementia.   Disc risk dependence.  Not ideal to use Bz with stimulants but he's struggled with function without it.  Disc ED and tx. has had problems with both Viagra and Cialis.  Suggest he discuss this with urologist.  Both citalopram and potentially metoprolol could be contributing to some of his ED problems.  He wants to retry Viagra.  Sent  Disc chronic stress with $ and difficulty keeping a job ongoing.   Supportive therapy dealing with his loneliness and addressing self-destructive behavior out of it.  Disc 12 steps and need to block the scammer. Supportive therapy dealing with work stress.  He's continued to have a lot of job instability which is a chronic stressor. Take advantage of faith resources.  No med changes. Continue duloxetine 40, Vyvanse 70, clonazepam 0.5 mg BID and 1.0 mg HS, Spread out clonazepam.  FU 3 mos  Meredith Staggers, MD, DFAPA    Please see After Visit Summary for patient specific instructions.  No future appointments.  No orders of the defined types were placed in this encounter.     -------------------------------

## 2020-10-10 ENCOUNTER — Other Ambulatory Visit: Payer: Self-pay | Admitting: Psychiatry

## 2020-10-10 DIAGNOSIS — F3341 Major depressive disorder, recurrent, in partial remission: Secondary | ICD-10-CM

## 2020-10-10 DIAGNOSIS — F4001 Agoraphobia with panic disorder: Secondary | ICD-10-CM

## 2020-10-10 DIAGNOSIS — F411 Generalized anxiety disorder: Secondary | ICD-10-CM

## 2020-10-10 NOTE — Telephone Encounter (Signed)
Last filled 9/8 appt on 11/15

## 2020-11-02 ENCOUNTER — Other Ambulatory Visit: Payer: Self-pay | Admitting: Psychiatry

## 2020-11-09 ENCOUNTER — Other Ambulatory Visit: Payer: Self-pay | Admitting: Psychiatry

## 2020-11-21 ENCOUNTER — Other Ambulatory Visit: Payer: Self-pay

## 2020-11-21 ENCOUNTER — Encounter: Payer: Self-pay | Admitting: Psychiatry

## 2020-11-21 ENCOUNTER — Ambulatory Visit (INDEPENDENT_AMBULATORY_CARE_PROVIDER_SITE_OTHER): Payer: Self-pay | Admitting: Psychiatry

## 2020-11-21 VITALS — BP 124/72 | HR 66

## 2020-11-21 DIAGNOSIS — F411 Generalized anxiety disorder: Secondary | ICD-10-CM

## 2020-11-21 DIAGNOSIS — N529 Male erectile dysfunction, unspecified: Secondary | ICD-10-CM

## 2020-11-21 DIAGNOSIS — F3341 Major depressive disorder, recurrent, in partial remission: Secondary | ICD-10-CM

## 2020-11-21 DIAGNOSIS — F5105 Insomnia due to other mental disorder: Secondary | ICD-10-CM

## 2020-11-21 DIAGNOSIS — F900 Attention-deficit hyperactivity disorder, predominantly inattentive type: Secondary | ICD-10-CM

## 2020-11-21 DIAGNOSIS — F4001 Agoraphobia with panic disorder: Secondary | ICD-10-CM

## 2020-11-21 MED ORDER — DULOXETINE HCL 20 MG PO CPEP: 40.0000 mg | ORAL_CAPSULE | Freq: Every day | ORAL | 5 refills | Status: DC

## 2020-11-21 MED ORDER — CLONAZEPAM 0.5 MG PO TABS: ORAL_TABLET | ORAL | 2 refills | Status: DC

## 2020-11-21 MED ORDER — LISDEXAMFETAMINE DIMESYLATE 70 MG PO CAPS: 70.0000 mg | ORAL_CAPSULE | Freq: Every day | ORAL | 0 refills | Status: DC

## 2020-11-21 NOTE — Progress Notes (Signed)
Wyatt Gallegos [id number] 12-10-1955 65 y.o.     Subjective:   Patient ID:  Wyatt Gallegos is a 65 y.o. (DOB 1955/11/26) male.  Chief Complaint:  Chief Complaint  Patient presents with   Follow-up   Depression   Anxiety   ADHD    Anxiety Symptoms include decreased concentration and nervous/anxious behavior. Patient reports no confusion, palpitations or suicidal ideas.    Wyatt Gallegos presents today for follow-up of depression, anxiety, and ADD HD.  He has been under my care for many years.  At visit May 2020 and was fatigue and we reduced Lexapro to help.  It did not help so we switched to duloxetine  40 and it helped.  He has some chronic anxiety and was taking clonazepam at night and half of a clonazepam tablet in the morning for sleep and anxiety respectively   08/31/19 appt with the following noted: On 40 mg duloxetine, Vyvanse 70, clonazepam 0.5 mg prn. Took [employer] job but [place] far in [place]  [employer] next job unloading trucks.  Not consistent hours so poor pay.   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  Then on with [employer] in [place] but fired in 2 days bc he wasn't fast enough. 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  Has a potential at [employer]. Trying to stay positive. Overall really pleased.  Still pleased with duloxetine and feels like his normal self.  Patient reports stable mood and denies depressed or irritable moods.  Patient denies any recent difficulty with anxiety.  Patient denies difficulty with sleep initiation or maintenance. Denies appetite disturbance.  Patient reports that energy and motivation have been good.  Patient denies any difficulty with concentration.  Patient denies any suicidal ideation. Very tense if runs out of clonazepam. Still doing counseling.  Exercising 3 times weekly. Clonazepam really helped take the edge off anxiety during the day and could  focus better bc less anxiety.  Taking 0.5 mg AM and 1.0 mg HS.  Keeps anxiety from paralyzing him.  Getting sleep.  Wants to take it twice daily.  Sleep well with Klonopin. Still benefit with Vyvanse for focus. Plan: no med changes  10/22/2019 telephone call from patient requesting early refill by 2 weeks on clonazepam that was refused.  Patient was warned not to exceed recommended dosing on controlled substance or it would be discontinued.  12/20/19 appt with following noted: PDMP reviewed and shows Vyvanse 70 mg filled on a regular schedule.  The last clonazepam 0.5 mg tablets was filled on 11/07/2019 420 tablets. Says he lost the clonazepam RX.  Was in a lot of social turmoil and unrest in living situation.  Says 4 weeks later he found it.  Denies he was overtaking the med.   Situation with exW upsetting bc he feels she left him over money.  Still grief though has done grief work in grief share.  Recognizes chronic job instability. Just got suspended from [employer] working 15 hours days.   Over trivial matter of being falsely accused of sexual harassment.  Works hard all day.   He called HR and they said they are investigating it.   Feels he needs the clonazepam and the Vyvanse and benefits from duloxetine. Plan: No med changes. 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  New job better pay.    Getting Vyvanse through the program.   $ stress with [employer] cutting back hours.  Was hard on his R knee.  Better without the job. Plan no med changes  08/21/20 appt noted: Has remained on Vyvanse with Pt Assistance. Lonely through [employer] and feeling sucked into a hole by a woman.  Getting scammed.  Trying to get out of the scam.    Has talked to men at church about it.  While up in [place] to deal with it got pulled over and car impounded for not paying a ticket.  Had to walk 20 miles and hitchhiked.   Nephew helped him out spent $1200 to help pt.  Has asked others for money after giving scammer money.   Still losing and changing jobs. Lost another job and Covid has hurt him and falsely accused of something at [employer] and lost a month of income and then cleared in investigation. Plan: No med changes. Continue duloxetine 40, Vyvanse 70, clonazepam 0.5 mg BID and 1.0 mg HS, Spread out clonazepam.  11/21/2020 appointment with the following noted: [employer] Applying for social security DT age.  Hasn't been able to be successful at keeping a FT job in spite of repeated attempts over many years.  MD supports this and he's aware. D scholarship in medical field. Consistent with meds: duloxetine 40, Vyvanse 70, gabapentin 100 TID, clonazepam 0.5 BID and 1.0 mg HS prn. Ex wife is not always hostile. States again he didn't over take clonazepam but lost a bottle. Involved in men's group at church.  Still going to counseling.  Denies her alcohol or caffeine excess.  Past Psychiatric Medication Trials: Prozac, Zoloft, Lexapro fatigue. Wellbutrin 450, Duloxetine 40, Buspar,  Concerta, Ritalin, Provigil, Vyvanse 70, modafinil,   Clonazepam 0.5 mg 4 tablets daily  Review of Systems:  Review of Systems  Constitutional:  Negative for fatigue.  Cardiovascular:  Negative for palpitations.  Genitourinary:  Negative for decreased urine volume and difficulty urinating.       ED  Musculoskeletal:  Positive for arthralgias and myalgias.  Neurological:  Negative for tremors and weakness.  Psychiatric/Behavioral:  Positive for behavioral problems and decreased concentration. Negative for agitation, confusion, dysphoric mood, hallucinations, self-injury, sleep disturbance and suicidal ideas. The patient is nervous/anxious. The patient is not hyperactive.    Medications: I have reviewed the patient's current medications.  Current Outpatient Medications  Medication Sig Dispense Refill   gabapentin (NEURONTIN) 100  MG capsule Take 100 mg by mouth 3 (three) times daily. Mg unknown     meloxicam (MOBIC) 15 MG tablet Take 15 mg by mouth daily.  0   metoprolol tartrate (LOPRESSOR) 25 MG tablet TAKE ONE TABLET BY MOUTH ONE TIME DAILY 90 tablet 0   sildenafil (VIAGRA) 100 MG tablet TAKE ONE TABLET BY MOUTH DAILY AS NEEDED FOR ERECTILE DYSFUNCTION 30 tablet 0   tadalafil (CIALIS) 5 MG tablet Take by mouth.     tamsulosin (FLOMAX) 0.4 MG CAPS capsule Take 0.4 mg by mouth daily.  0   testosterone cypionate (DEPOTESTOSTERONE CYPIONATE) 200 MG/ML injection INJECT INTO THE MUSCLE EVERY 14 DAYS     clonazePAM (KLONOPIN) 0.5 MG tablet TAKE ONE TABLET BY MOUTH EVERY MORNING, TAKE ONE TABLET BY MOUTH IN THE AFTERNOON, AND TAKE TWO TABLETS BY MOUTH AT BEDTIME IF NEEDED FOR ANXIETY (NO EARLY REFILLS) 120 tablet 2   DULoxetine (CYMBALTA) 20 MG capsule Take 2 capsules (40 mg total) by mouth daily. 60 capsule 5   [  START ON 01/16/2021] lisdexamfetamine (VYVANSE) 70 MG capsule Take 1 capsule (70 mg total) by mouth daily. 30 capsule 0   [START ON 12/19/2020] lisdexamfetamine (VYVANSE) 70 MG capsule Take 1 capsule (70 mg total) by mouth daily. 30 capsule 0   lisdexamfetamine (VYVANSE) 70 MG capsule Take 1 capsule (70 mg total) by mouth daily. 30 capsule 0   No current facility-administered medications for this visit.    Medication Side Effects: ? ED related  Allergies: No Known Allergies  History reviewed. No pertinent past medical history.  History reviewed. No pertinent family history.  Social History   Socioeconomic History   Marital status: Married    Spouse name: Not on file   Number of children: Not on file   Years of education: Not on file   Highest education level: Not on file  Occupational History   Not on file  Tobacco Use   Smoking status: Never   Smokeless tobacco: Never  Substance and Sexual Activity   Alcohol use: Never   Drug use: Never   Sexual activity: Not on file  Other Topics Concern    Not on file  Social History Narrative   Not on file   Social Determinants of Health   Financial Resource Strain: Not on file  Food Insecurity: Not on file  Transportation Needs: Not on file  Physical Activity: Not on file  Stress: Not on file  Social Connections: Not on file  Intimate Partner Violence: Not on file    Past Medical History, Surgical history, Social history, and Family history were reviewed and updated as appropriate.   Please see review of systems for further details on the patient's review from today.   Objective:   Physical Exam:  BP 124/72   Pulse 66   Physical Exam Constitutional:      General: He is not in acute distress. Musculoskeletal:        General: No deformity.  Neurological:     Mental Status: He is alert and oriented to person, place, and time.     Cranial Nerves: No dysarthria.     Coordination: Coordination normal.  Psychiatric:        Attention and Perception: Perception normal. He is inattentive. He does not perceive auditory or visual hallucinations.        Mood and Affect: Mood is anxious and depressed. Affect is not labile, blunt, angry or inappropriate.        Speech: Speech normal.        Behavior: Behavior normal. Behavior is cooperative.        Thought Content: Thought content normal. Thought content is not paranoid or delusional. Thought content does not include homicidal or suicidal ideation. Thought content does not include homicidal or suicidal plan.        Cognition and Memory: Cognition and memory normal.        Judgment: Judgment normal. Judgment is not inappropriate.     Comments: Insight good  Some chronic stress and anxiety    Lab Review:     Component Value Date/Time   NA 136 07/04/2017 1551   K 3.8 07/04/2017 1551   CL 101 07/04/2017 1551   CO2 23 07/04/2017 1551   GLUCOSE 108 (H) 07/04/2017 1551   BUN 20 07/04/2017 1551   CREATININE 1.11 07/04/2017 1551   CALCIUM 9.5 07/04/2017 1551   GFRNONAA >60 07/04/2017  1551   GFRAA >60 07/04/2017 1551       Component Value Date/Time   WBC  6.5 07/04/2017 1551   RBC 5.33 07/04/2017 1551   HGB 16.3 07/04/2017 1551   HCT 48.5 07/04/2017 1551   PLT 205 07/04/2017 1551   MCV 91.1 07/04/2017 1551   MCH 30.6 07/04/2017 1551   MCHC 33.6 07/04/2017 1551   RDW 14.2 07/04/2017 1551    No results found for: POCLITH, LITHIUM   No results found for: PHENYTOIN, PHENOBARB, VALPROATE, CBMZ   .res Assessment: Plan:   Pesach was seen today for follow-up, depression, anxiety and adhd.  Diagnoses and all orders for this visit:  Depression, major, recurrent, in partial remission (HCC) -     DULoxetine (CYMBALTA) 20 MG capsule; Take 2 capsules (40 mg total) by mouth daily. -     clonazePAM (KLONOPIN) 0.5 MG tablet; TAKE ONE TABLET BY MOUTH EVERY MORNING, TAKE ONE TABLET BY MOUTH IN THE AFTERNOON, AND TAKE TWO TABLETS BY MOUTH AT BEDTIME IF NEEDED FOR ANXIETY (NO EARLY REFILLS)  Attention deficit hyperactivity disorder (ADHD), predominantly inattentive type -     lisdexamfetamine (VYVANSE) 70 MG capsule; Take 1 capsule (70 mg total) by mouth daily. -     lisdexamfetamine (VYVANSE) 70 MG capsule; Take 1 capsule (70 mg total) by mouth daily. -     lisdexamfetamine (VYVANSE) 70 MG capsule; Take 1 capsule (70 mg total) by mouth daily.  Panic disorder with agoraphobia -     DULoxetine (CYMBALTA) 20 MG capsule; Take 2 capsules (40 mg total) by mouth daily. -     clonazePAM (KLONOPIN) 0.5 MG tablet; TAKE ONE TABLET BY MOUTH EVERY MORNING, TAKE ONE TABLET BY MOUTH IN THE AFTERNOON, AND TAKE TWO TABLETS BY MOUTH AT BEDTIME IF NEEDED FOR ANXIETY (NO EARLY REFILLS)  Generalized anxiety disorder -     DULoxetine (CYMBALTA) 20 MG capsule; Take 2 capsules (40 mg total) by mouth daily. -     clonazePAM (KLONOPIN) 0.5 MG tablet; TAKE ONE TABLET BY MOUTH EVERY MORNING, TAKE ONE TABLET BY MOUTH IN THE AFTERNOON, AND TAKE TWO TABLETS BY MOUTH AT BEDTIME IF NEEDED FOR ANXIETY (NO  EARLY REFILLS)  Insomnia due to mental condition  Erectile disorder, acquired, situational, severe   Still mains good maintains good response with regard to depression and anxiety with the switch from Lexapro and Wellbutrin to duloxetine 40 mg daily. Less anxiety and fatigue with duloxetine 40 mg overall but chronically stressed.    However he still needs the clonazepam as noted 0.5 mg tablets 1 every morning and 2 nightly for sleep and anxiety.  He is not ideal that he is taking clonazepam in the morning along with Vyvanse but his anxiety has been debilitating at times and has interfered with his ability to function and work settings and maintain employment.  Have not been able to manage the anxiety by usual alternative preferred methods having failed multiple SSRIs.  Discussed potential benefits, risks, and side effects of stimulants with patient to include increased heart rate, palpitations, insomnia, increased anxiety, increased irritability, or decreased appetite.  Instructed patient to contact office if experiencing any significant tolerability issues. Still has sig ADD sx remaining and consider off label memantine.  We discussed the short-term risks associated with benzodiazepines including sedation and increased fall risk among others.  Discussed long-term side effect risk including dependence, potential withdrawal symptoms, and the potential eventual dose-related risk of dementia.   Disc risk dependence.  Not ideal to use Bz with stimulants but he's struggled with function without it.  Disc ED and tx. has had problems with  both Viagra and Cialis.  Suggest he discuss this with urologist.  Both citalopram and potentially metoprolol could be contributing to some of his ED problems.  He wants to retry Viagra.  Sent  Disc chronic stress with $ and difficulty keeping a job ongoing.   Supportive therapy dealing with his loneliness and addressing self-destructive behavior out of it.  Disc 12  steps and need to block the scammer. Supportive therapy dealing with work stress.  He's continued to have a lot of job instability which is a chronic stressor. Take advantage of faith resources.  No med changes. Continue duloxetine 40, Vyvanse 70, clonazepam 0.5 mg BID and 1.0 mg HS, Spread out clonazepam.  FU 4-6 mos  Meredith Staggers, MD, DFAPA    Please see After Visit Summary for patient specific instructions.  No future appointments.  No orders of the defined types were placed in this encounter.     -------------------------------

## 2021-01-08 ENCOUNTER — Other Ambulatory Visit: Payer: Self-pay | Admitting: Psychiatry

## 2021-01-08 DIAGNOSIS — N529 Male erectile dysfunction, unspecified: Secondary | ICD-10-CM

## 2021-01-09 NOTE — Telephone Encounter (Signed)
Do you still rx this?

## 2021-01-21 ENCOUNTER — Other Ambulatory Visit: Payer: Self-pay | Admitting: Psychiatry

## 2021-02-07 ENCOUNTER — Other Ambulatory Visit: Payer: Self-pay | Admitting: Psychiatry

## 2021-02-07 DIAGNOSIS — N529 Male erectile dysfunction, unspecified: Secondary | ICD-10-CM

## 2021-02-09 ENCOUNTER — Other Ambulatory Visit: Payer: Self-pay | Admitting: Psychiatry

## 2021-02-09 DIAGNOSIS — F411 Generalized anxiety disorder: Secondary | ICD-10-CM

## 2021-02-09 DIAGNOSIS — F4001 Agoraphobia with panic disorder: Secondary | ICD-10-CM

## 2021-02-09 DIAGNOSIS — F3341 Major depressive disorder, recurrent, in partial remission: Secondary | ICD-10-CM

## 2021-03-12 ENCOUNTER — Telehealth: Payer: Self-pay | Admitting: Psychiatry

## 2021-03-12 DIAGNOSIS — F900 Attention-deficit hyperactivity disorder, predominantly inattentive type: Secondary | ICD-10-CM

## 2021-03-13 NOTE — Telephone Encounter (Signed)
Error, script already filled..sorry I couldn't sign the encounter because it is a refill encounter I was adding info on to.  Pls sign the encounter to close it. ?

## 2021-04-10 ENCOUNTER — Telehealth: Payer: Self-pay | Admitting: Psychiatry

## 2021-04-10 ENCOUNTER — Other Ambulatory Visit: Payer: Self-pay | Admitting: Psychiatry

## 2021-04-10 DIAGNOSIS — F900 Attention-deficit hyperactivity disorder, predominantly inattentive type: Secondary | ICD-10-CM

## 2021-04-10 MED ORDER — LISDEXAMFETAMINE DIMESYLATE 70 MG PO CAPS: 70.0000 mg | ORAL_CAPSULE | Freq: Every day | ORAL | 0 refills | Status: DC

## 2021-04-10 NOTE — Telephone Encounter (Signed)
Pt lvm at 4:38 pm asking for a refill on his vyvanse 70 mg. [facility] is [facility] in [place]. Next appt 5/15 ?

## 2021-04-19 ENCOUNTER — Telehealth: Payer: Self-pay | Admitting: Psychiatry

## 2021-04-19 ENCOUNTER — Other Ambulatory Visit: Payer: Self-pay

## 2021-04-19 DIAGNOSIS — F900 Attention-deficit hyperactivity disorder, predominantly inattentive type: Secondary | ICD-10-CM

## 2021-04-19 MED ORDER — LISDEXAMFETAMINE DIMESYLATE 70 MG PO CAPS: 70.0000 mg | ORAL_CAPSULE | Freq: Every day | ORAL | 0 refills | Status: DC

## 2021-04-19 NOTE — Telephone Encounter (Signed)
PT just recently reapplied for patient assistance for Vyvanse. He used the 4/4 RX for Vyvanse but only got #7 since he had to pay out of pocket for it. He is now enrolled in the patient assist program and will need a #30 day Vyvanse sent in to [facility] pharmacy on [address], in [place], [place]. ?

## 2021-04-19 NOTE — Telephone Encounter (Signed)
Pended.

## 2021-05-21 ENCOUNTER — Ambulatory Visit (INDEPENDENT_AMBULATORY_CARE_PROVIDER_SITE_OTHER): Payer: Self-pay | Admitting: Psychiatry

## 2021-05-21 ENCOUNTER — Encounter: Payer: Self-pay | Admitting: Psychiatry

## 2021-05-21 DIAGNOSIS — F5105 Insomnia due to other mental disorder: Secondary | ICD-10-CM

## 2021-05-21 DIAGNOSIS — F3341 Major depressive disorder, recurrent, in partial remission: Secondary | ICD-10-CM

## 2021-05-21 DIAGNOSIS — F411 Generalized anxiety disorder: Secondary | ICD-10-CM

## 2021-05-21 DIAGNOSIS — F4001 Agoraphobia with panic disorder: Secondary | ICD-10-CM

## 2021-05-21 DIAGNOSIS — F900 Attention-deficit hyperactivity disorder, predominantly inattentive type: Secondary | ICD-10-CM

## 2021-05-21 DIAGNOSIS — N529 Male erectile dysfunction, unspecified: Secondary | ICD-10-CM

## 2021-05-21 MED ORDER — LISDEXAMFETAMINE DIMESYLATE 70 MG PO CAPS: 70.0000 mg | ORAL_CAPSULE | Freq: Every day | ORAL | 0 refills | Status: DC

## 2021-05-21 MED ORDER — TADALAFIL 5 MG PO TABS: 5.0000 mg | ORAL_TABLET | Freq: Every day | ORAL | 2 refills | Status: DC

## 2021-05-21 MED ORDER — DULOXETINE HCL 20 MG PO CPEP: 40.0000 mg | ORAL_CAPSULE | Freq: Every day | ORAL | 5 refills | Status: DC

## 2021-05-21 MED ORDER — CLONAZEPAM 0.5 MG PO TABS: ORAL_TABLET | ORAL | 5 refills | Status: DC

## 2021-05-21 NOTE — Progress Notes (Signed)
Wyatt Gallegos ?[id number] ?03-04-1955 ?66 y.o.  ? ? ? ?Subjective:  ? ?Patient ID:  Wyatt Gallegos is a 66 y.o. (DOB April 07, 1955) male. ? ?Chief Complaint:  ?Chief Complaint  ?Patient presents with  ? Follow-up  ? ADD  ? Anxiety  ? ? ?Anxiety ?Symptoms include decreased concentration and nervous/anxious behavior. Patient reports no confusion, palpitations or suicidal ideas.  ? ? ?[name] presents today for follow-up of depression, anxiety, and ADD HD.  He has been under my care for many years. ? ?At visit May 2020 and was fatigue and we reduced Lexapro to help.  It did not help so we switched to duloxetine  40 and it helped.  He has some chronic anxiety and was taking clonazepam at night and half of a clonazepam tablet in the morning for sleep and anxiety respectively  ? ?08/31/19 appt with the following noted: ?On 40 mg duloxetine, Vyvanse 70, clonazepam 0.5 mg prn. ?Took [employer] job but too far in [place].  [employer] next job unloading trucks.  Not consistent hours so poor pay.   ?Then in delivery didn't work out bc poor internet and couldn't find his way around.  Then on with [employer] in [place] but fired in 2 days bc he wasn't fast enough. 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  Has a potential at [employer]. ?Trying to stay positive. ?Overall really pleased.  Still pleased with duloxetine and feels like his normal self.  Patient reports stable mood and denies depressed or irritable moods.  Patient denies any recent difficulty with anxiety.  Patient denies difficulty with sleep initiation or maintenance. Denies appetite disturbance.  Patient reports that energy and motivation have been good.  Patient denies any difficulty with concentration.  Patient denies any suicidal ideation. ?Very tense if runs out of clonazepam. ?Still doing counseling.  Exercising 3 times weekly. ?Clonazepam really helped take the edge off anxiety during the day and could focus better bc  less anxiety.  Taking 0.5 mg AM and 1.0 mg HS.  Keeps anxiety from paralyzing him.  Getting sleep.  Wants to take it twice daily.  Sleep well with Klonopin. ?Still benefit with Vyvanse for focus. ?Plan: no med changes ? ?10/22/2019 telephone call from patient requesting early refill by 2 weeks on clonazepam that was refused.  Patient was warned not to exceed recommended dosing on controlled substance or it would be discontinued. ? ?12/20/19 appt with following noted: ?PDMP reviewed and shows Vyvanse 70 mg filled on a regular schedule.  The last clonazepam 0.5 mg tablets was filled on 11/07/2019 420 tablets. ?Says he lost the clonazepam RX.  Was in a lot of social turmoil and unrest in living situation.  Says 4 weeks later he found it.  Denies he was overtaking the med.   ?Situation with exW upsetting bc he feels she left him over money.  Still grief though has done grief work in grief share.  Recognizes chronic job instability. ?Just got suspended from [employer] working 15 hours days.   Over trivial matter of being falsely accused of sexual harassment.  Works hard all day.   He called HR and they said they are investigating it.   ?Feels he needs the clonazepam and the Vyvanse and benefits from duloxetine. ?Plan: No med changes. ?Continue duloxetine 40, Vyvanse 70, clonazepam 0.5 mg BID and 1.0 mg HS, ?  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 ?New job better  pay.    ?Getting Vyvanse through the program.   ?$ stress with [employer] cutting back hours.  Was hard on his R knee.  Better without the job. ?Plan no med changes ? ?08/21/20 appt noted: ?Has remained on Vyvanse with Pt Assistance. ?[place]onely [place] and feeling sucked into a hole by a woman.  Getting scammed.  Trying to get out of the scam.    Has talked to men at church about it.  While up in [place] to deal with it got pulled over and car impounded for not paying a ticket.  Had to walk 20 miles and hitchhiked.  Nephew helped  him out spent $1200 to help pt.  Has asked others for money after giving scammer money.   ?Still losing and changing jobs. Lost another job and Covid has hurt him and [facility] [employer] and lost a month of income and then cleared in investigation. ?Plan: No med changes. ?Continue duloxetine 40, Vyvanse 70, clonazepam 0.5 mg BID and 1.0 mg HS, ?Spread out clonazepam. ? ?11/21/2020 appointment with the following noted: ?[employer] Applying for social security DT age.  Hasn't been able to be successful at keeping a FT job in spite of repeated attempts over many years.  MD supports this and he's aware. ?D scholarship in medical field. ?Consistent with meds: duloxetine 40, Vyvanse 70, gabapentin 100 TID, clonazepam 0.5 BID and 1.0 mg HS prn. ?Ex wife is not always hostile. ?States again he didn't over take clonazepam but lost a bottle. ?Involved in men's group at church. ?Plan: No med changes. ?Continue duloxetine 40, Vyvanse 70, clonazepam 0.5 mg BID and 1.0 mg HS, ?Spread out clonazepam. ? ?05/21/21 appt noted: ?[employer] no SE with meds. ?Overall doing ok with current meds. ?Still exercises playing tennis. ?Works hard [employer] 4 hours 5 nights per week.  Does his job well. At 9 mos gets benefits. ?Second job [employer]. ?D one more year of school. 66 yo. ? ?Still going to counseling. ? ?Denies her alcohol or caffeine excess. ? ?Past Psychiatric Medication Trials: ?Prozac, Zoloft, Lexapro fatigue. Wellbutrin 450, Duloxetine 40, Buspar,  ?Concerta, Ritalin, Provigil, Vyvanse 70, modafinil,   ?Clonazepam 0.5 mg 4 tablets daily ? ?Review of Systems:  ?Review of Systems  ?Constitutional:  Negative for fatigue.  ?Cardiovascular:  Negative for palpitations.  ?Genitourinary:  Negative for decreased urine volume and difficulty urinating.  ?     ED  ?Musculoskeletal:  Positive for arthralgias and myalgias.  ?Neurological:  Negative for tremors.  ?Psychiatric/Behavioral:  Positive for behavioral problems and decreased  concentration. Negative for agitation, confusion, dysphoric mood, hallucinations, self-injury, sleep disturbance and suicidal ideas. The patient is nervous/anxious. The patient is not hyperactive.   ? ?Medications: I have reviewed the patient's current medications. ? ?Current Outpatient Medications  ?Medication Sig Dispense Refill  ? gabapentin (NEURONTIN) 100 MG capsule Take 100 mg by mouth 3 (three) times daily. Mg unknown    ? meloxicam (MOBIC) 15 MG tablet Take 15 mg by mouth daily.  0  ? metoprolol tartrate (LOPRESSOR) 25 MG tablet TAKE ONE TABLET BY MOUTH ONE TIME DAILY 90 tablet 0  ? sildenafil (VIAGRA) 100 MG tablet TAKE ONE TABLET BY MOUTH ONE TIME DAILY AS NEEDED FOR ERECTILE DYSFUNCTION 30 tablet 0  ? tamsulosin (FLOMAX) 0.4 MG CAPS capsule Take 0.4 mg by mouth daily.  0  ? testosterone cypionate (DEPOTESTOSTERONE CYPIONATE) 200 MG/ML injection INJECT 1ML INTO THE MUSCLE EVERY 14 DAYS    ? clonazePAM (KLONOPIN)  0.5 MG tablet TAKE ONE TABLET BY MOUTH EVERY MORNING, TAKE 1 TABLET EVERY AFTERNOON, AND 2 TABLETS AT BEDTIME IF NEEDED 120 tablet 5  ? DULoxetine (CYMBALTA) 20 MG capsule Take 2 capsules (40 mg total) by mouth daily. 60 capsule 5  ? [START ON 07/16/2021] lisdexamfetamine (VYVANSE) 70 MG capsule Take 1 capsule (70 mg total) by mouth daily. 30 capsule 0  ? [START ON 06/18/2021] lisdexamfetamine (VYVANSE) 70 MG capsule Take 1 capsule (70 mg total) by mouth daily. 30 capsule 0  ? lisdexamfetamine (VYVANSE) 70 MG capsule Take 1 capsule (70 mg total) by mouth daily. 30 capsule 0  ? tadalafil (CIALIS) 5 MG tablet Take 1 tablet (5 mg total) by mouth daily in the afternoon. 30 tablet 2  ? ?No current facility-administered medications for this visit.  ? ? ?Medication Side Effects: ? ED related ? ?Allergies: No Known Allergies ? ?History reviewed. No pertinent past medical history. ? ?History reviewed. No pertinent family history. ? ?Social History  ? ?Socioeconomic History  ? Marital status: Married  ?   Spouse name: Not on file  ? Number of children: Not on file  ? Years of education: Not on file  ? Highest education level: Not on file  ?Occupational History  ? Not on file  ?Tobacco Use  ? Smoking status: Darrold Junker

## 2021-05-29 ENCOUNTER — Other Ambulatory Visit: Payer: Self-pay | Admitting: Psychiatry

## 2021-06-25 ENCOUNTER — Other Ambulatory Visit: Payer: Self-pay | Admitting: Psychiatry

## 2021-06-28 ENCOUNTER — Other Ambulatory Visit: Payer: Self-pay | Admitting: Psychiatry

## 2021-07-12 ENCOUNTER — Other Ambulatory Visit: Payer: Self-pay | Admitting: Internal Medicine

## 2021-07-12 ENCOUNTER — Ambulatory Visit
Admission: RE | Admit: 2021-07-12 | Discharge: 2021-07-12 | Disposition: A | Discharge status: Home / Self Care | Payer: BLUE CROSS/BLUE SHIELD | Source: Ambulatory Visit | Attending: Internal Medicine | Admitting: Internal Medicine

## 2021-07-12 DIAGNOSIS — R600 Localized edema: Secondary | ICD-10-CM | POA: Insufficient documentation

## 2021-08-13 ENCOUNTER — Telehealth: Payer: Self-pay | Admitting: Psychiatry

## 2021-08-13 NOTE — Telephone Encounter (Signed)
Wyatt Gal[employer]os stating his pharmacy was low on Vyvanse when they dispensed his refill. He was only given 7 capsules. Patient is requesting a new Rx per pharmacy for his refill.  Contact information # [phone]  Next appointment 11/22/21

## 2021-08-13 NOTE — Telephone Encounter (Signed)
LVM to [facility]. Does pharmacy have in stock?

## 2021-08-14 NOTE — Telephone Encounter (Signed)
Last filled 8/4 for a 7-day supply. Will send in new Rx on 8/9. Patient is aware that pharmacy did not have in stock this morning, was expecting a shipment yesterday. He said to go ahead and send [facility] to [facility].

## 2021-08-14 NOTE — Telephone Encounter (Signed)
I called pharmacy and they do not have medication in stock.

## 2021-08-15 ENCOUNTER — Telehealth: Payer: Self-pay

## 2021-08-15 DIAGNOSIS — F900 Attention-deficit hyperactivity disorder, predominantly inattentive type: Secondary | ICD-10-CM

## 2021-08-15 MED ORDER — LISDEXAMFETAMINE DIMESYLATE 70 MG PO CAPS: 70.0000 mg | ORAL_CAPSULE | Freq: Every day | ORAL | 0 refills | Status: DC

## 2021-08-15 NOTE — Telephone Encounter (Signed)
Pended.

## 2021-08-17 ENCOUNTER — Other Ambulatory Visit: Payer: Self-pay

## 2021-08-17 MED ORDER — LISDEXAMFETAMINE DIMESYLATE 40 MG PO CAPS: 40.0000 mg | ORAL_CAPSULE | ORAL | 0 refills | Status: DC

## 2021-08-17 NOTE — Telephone Encounter (Signed)
Pharmacy called and would like a different strength of vyvanse sent in due to not having 70 mg in stock. So they want a 40 or 50 mg script sent in

## 2021-08-17 NOTE — Telephone Encounter (Signed)
Pended.

## 2021-08-27 ENCOUNTER — Telehealth: Payer: Self-pay | Admitting: Psychiatry

## 2021-08-27 NOTE — Telephone Encounter (Signed)
No record that we have filled. Nothing in Epic history. He has contacted PCP to fill.

## 2021-08-27 NOTE — Telephone Encounter (Signed)
Pt requesting RF on Testosterone @ [facility], apt 11/16

## 2021-09-05 ENCOUNTER — Telehealth: Payer: Self-pay

## 2021-09-05 NOTE — Telephone Encounter (Signed)
Late entry. Patient called to say that he was going to run out of his Vyvanse early. He said the drop from 70 to 40 was too much.  At the time 40 mg was the highest dose possible. I tried to reach patient to see what he wanted to do, piece 2 doses together or he could contact another pharmacy,  but was unable to reach him and pended Rx for 40 mg, only 1 qd.  I tried to explain to patient that he could not take 2 of the 40 mg, that it was too much medication. He was not happy and said that I was trying to argue with him when I was trying to explain to him why he could not take a total of 80 mg.  According to the database he filled Rx for 40 mg on 08/18/21.

## 2021-09-06 NOTE — Telephone Encounter (Signed)
He cannot get stimulants from 2 different providers .  He has Adderall RX ffrom another provider so we will not send another Vyvanse.  He cannot take more than 70 mg Vyvanse daily and we did not agree to 2 of the 40 mg capsules daily.  We will not refill this early and he cannot take it with Adderall

## 2021-09-06 NOTE — Telephone Encounter (Signed)
Received call from [facility]ist at [facility] in [place] on 09/05/2021 reporting pt is out of Vyvanse 40 mg and he reports a [facility]ist at [facility] told him to take 2/day. This was verified that the pharmacist did NOT instruct pt to take 2/day, nor did anyone at our office. Pharmacist also reports another provider in [place] sent in a Rx for Adderall XR 30 mg for him to be filled and they wanted to make sure Dr. Jennelle Human was aware. After discussion with Dr. Jennelle Human to explain what happen while he was out of town he decided not to give pt anymore Vyvanse at this time, pt also is on pt. Assistance with his Vyvanse. He advised me to let pharmacist know they can decide between the pt if they want to fill the Adderall but Dr. Jennelle Human will not make that decision since he did not prescribe it.   Tried several times to reach pharmacist again but was on hold and kept getting rerouted to leaving a message, will try to reach pharmacy again this morning with our information.

## 2021-09-06 NOTE — Telephone Encounter (Signed)
Pt already has adderall Rx on file from another provider

## 2021-09-06 NOTE — Telephone Encounter (Signed)
Please review

## 2021-09-06 NOTE — Telephone Encounter (Signed)
Wyatt Gallegos called this morning at 11:30 going over the notes already recorded in the previous message.  He did again state again that the pharmacist suggested he take 2 of the 40mg . He did this so he is out early.  They suggested he switch to Adderall while the shortage is in effect.  He was okay with the switch for the month of September but wants to go back  to [place] in October of available.  He has insurance starting tomorrow so he will be able to afford the medication going forward.  [facility]

## 2021-09-07 ENCOUNTER — Other Ambulatory Visit: Payer: Self-pay | Admitting: Psychiatry

## 2021-09-07 MED ORDER — AMPHETAMINE-DEXTROAMPHET ER 30 MG PO CP24: 30.0000 mg | ORAL_CAPSULE | Freq: Every day | ORAL | 0 refills | Status: DC

## 2021-09-07 NOTE — Telephone Encounter (Signed)
Pt notified a Rx for Adderall XR was submitted to [facility]

## 2021-09-07 NOTE — Telephone Encounter (Signed)
Spoke with pt earlier and now his [facility] that I did confirm that the Adderall XR 30 mg was CANCELED by his PCP, he didn't realize Dr. Jennelle Human was prescribing his stimulants so he didn't want to over step anything. So pt is out of Vyvanse and has no Adderall either.  Pt asking if Dr. Jennelle Human will send in Adderall for him? As of today pt has BCBS, he has worked at [employer] a year now.

## 2021-09-23 ENCOUNTER — Other Ambulatory Visit: Payer: Self-pay | Admitting: Psychiatry

## 2021-10-01 ENCOUNTER — Other Ambulatory Visit: Payer: Self-pay | Admitting: Psychiatry

## 2021-10-30 ENCOUNTER — Other Ambulatory Visit: Payer: Self-pay | Admitting: Psychiatry

## 2021-10-30 NOTE — Telephone Encounter (Signed)
Pt called requesting Generic adderall  XR 30 mg to [facility]. Apt 11/16

## 2021-10-30 NOTE — Telephone Encounter (Signed)
Filled 9/26 appt 11/16

## 2021-11-10 ENCOUNTER — Other Ambulatory Visit: Payer: Self-pay | Admitting: Psychiatry

## 2021-11-10 DIAGNOSIS — F411 Generalized anxiety disorder: Secondary | ICD-10-CM

## 2021-11-10 DIAGNOSIS — F3341 Major depressive disorder, recurrent, in partial remission: Secondary | ICD-10-CM

## 2021-11-10 DIAGNOSIS — F4001 Agoraphobia with panic disorder: Secondary | ICD-10-CM

## 2021-11-22 ENCOUNTER — Ambulatory Visit (INDEPENDENT_AMBULATORY_CARE_PROVIDER_SITE_OTHER): Payer: BC Managed Care – PPO | Admitting: Psychiatry

## 2021-11-22 ENCOUNTER — Encounter: Payer: Self-pay | Admitting: Psychiatry

## 2021-11-22 DIAGNOSIS — F3341 Major depressive disorder, recurrent, in partial remission: Secondary | ICD-10-CM | POA: Diagnosis not present

## 2021-11-22 DIAGNOSIS — F411 Generalized anxiety disorder: Secondary | ICD-10-CM

## 2021-11-22 DIAGNOSIS — N529 Male erectile dysfunction, unspecified: Secondary | ICD-10-CM

## 2021-11-22 DIAGNOSIS — F900 Attention-deficit hyperactivity disorder, predominantly inattentive type: Secondary | ICD-10-CM | POA: Diagnosis not present

## 2021-11-22 DIAGNOSIS — F5105 Insomnia due to other mental disorder: Secondary | ICD-10-CM

## 2021-11-22 DIAGNOSIS — F4001 Agoraphobia with panic disorder: Secondary | ICD-10-CM | POA: Diagnosis not present

## 2021-11-22 MED ORDER — LISDEXAMFETAMINE DIMESYLATE 70 MG PO CAPS: 70.0000 mg | ORAL_CAPSULE | Freq: Every day | ORAL | 0 refills | Status: DC

## 2021-11-22 MED ORDER — AMPHETAMINE-DEXTROAMPHET ER 25 MG PO CP24: 25.0000 mg | ORAL_CAPSULE | Freq: Every day | ORAL | 0 refills | Status: DC

## 2021-11-22 MED ORDER — SERTRALINE HCL 50 MG PO TABS: ORAL_TABLET | ORAL | 0 refills | Status: DC

## 2021-11-22 NOTE — Patient Instructions (Addendum)
Daily 1/2 of 10 mg Cialis tablet daily Start sertraline 1/2 tablet daily and reduce duloxetine to 1 daily for 1 week, Then stop duloxetine and increase sertraline to 1 tablet daily

## 2021-11-22 NOTE — Progress Notes (Signed)
Wyatt Gallegos [id number] 05-21-1955 66 y.o.     Subjective:   Patient ID:  Wyatt Gallegos is a 66 y.o. (DOB 1955/09/25) male.  Chief Complaint:  Chief Complaint  Patient presents with   Follow-up   ADHD   Depression   Anxiety    Anxiety Symptoms include decreased concentration and nervous/anxious behavior. Patient reports no chest pain, confusion, palpitations or suicidal ideas.    Depression        Associated symptoms include decreased concentration and myalgias.  Associated symptoms include no fatigue and no suicidal ideas.  Wyatt Gallegos presents today for follow-up of depression, anxiety, and ADD HD.  He has been under my care for many years.  At visit May 2020 and was fatigue and we reduced Lexapro to help.  It did not help so we switched to duloxetine  40 and it helped.  He has some chronic anxiety and was taking clonazepam at night and half of a clonazepam tablet in the morning for sleep and anxiety respectively   08/31/19 appt with the following noted: On 40 mg duloxetine, Vyvanse 70, clonazepam 0.5 mg prn. Took [employer] job but too far in [place].  [employer] next job unloading trucks.  Not consistent hours so poor pay.   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  Then on with [employer] in [place] but fired in 2 days bc he wasn't fast enough. 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  Has a potential at [employer]. Trying to stay positive. Overall really pleased.  Still pleased with duloxetine and feels like his normal self.  Patient reports stable mood and denies depressed or irritable moods.  Patient denies any recent difficulty with anxiety.  Patient denies difficulty with sleep initiation or maintenance. Denies appetite disturbance.  Patient reports that energy and motivation have been good.  Patient denies any difficulty with concentration.  Patient denies any suicidal ideation. Very  tense if runs out of clonazepam. Still doing counseling.  Exercising 3 times weekly. Clonazepam really helped take the edge off anxiety during the day and could focus better bc less anxiety.  Taking 0.5 mg AM and 1.0 mg HS.  Keeps anxiety from paralyzing him.  Getting sleep.  Wants to take it twice daily.  Sleep well with Klonopin. Still benefit with Vyvanse for focus. Plan: no med changes  10/22/2019 telephone call from patient requesting early refill by 2 weeks on clonazepam that was refused.  Patient was warned not to exceed recommended dosing on controlled substance or it would be discontinued.  12/20/19 appt with following noted: PDMP reviewed and shows Vyvanse 70 mg filled on a regular schedule.  The last clonazepam 0.5 mg tablets was filled on 11/07/2019 420 tablets. Says he lost the clonazepam RX.  Was in a lot of social turmoil and unrest in living situation.  Says 4 weeks later he found it.  Denies he was overtaking the med.   Situation with exW upsetting bc he feels she left him over money.  Still grief though has done grief work in grief share.  Recognizes chronic job instability. Just got suspended from [employer] working 15 hours days.   Over trivial matter of being falsely accused of sexual harassment.  Works hard all day.   He called HR and they said they are investigating it.   Feels he needs the clonazepam and the Vyvanse and benefits from duloxetine. Plan: No  med changes. Continue duloxetine 40, Vyvanse 70, clonazepam 0.5 mg BID and 1.0 mg HS,   04/03/2020 appointment with following noted: D accepted [facility] [employer] [facility]. New job better pay.    Getting Vyvanse through the program.   $ stress with [employer] cutting back hours.  Was hard on his R knee.  Better without the job. Plan no med changes  08/21/20 appt noted: Has remained on Vyvanse with Pt Assistance. Lonely through [place] and feeling sucked into a hole by a woman.  Getting scammed.  Trying to get out of the scam.    Has  talked to men at church about it.  While up in [place] to deal with it got pulled over and car impounded for not paying a ticket.  Had to walk 20 miles and hitchhiked.  Nephew helped him out spent $1200 to help pt.  Has asked others for money after giving scammer money.   Still losing and changing jobs. Lost another job and Covid has hurt him and falsely accused of something at [employer] [employer] and lost a month of income and then cleared in investigation. Plan: No med changes. Continue duloxetine 40, Vyvanse 70, clonazepam 0.5 mg BID and 1.0 mg HS, Spread out clonazepam.  11/21/2020 appointment with the following noted: Wyatt Gallegos Applying for social security DT age.  Hasn't been able to be successful at keeping a FT job in spite of repeated attempts over many years.  MD supports this and he's aware. D scholarship in medical field. Consistent with meds: duloxetine 40, Vyvanse 70, gabapentin 100 TID, clonazepam 0.5 BID and 1.0 mg HS prn. Ex wife is not always hostile. States again he didn't over take clonazepam but lost a bottle. Involved in men's group at church. Plan: No med changes. Continue duloxetine 40, Vyvanse 70, clonazepam 0.5 mg BID and 1.0 mg HS, Spread out clonazepam.  05/21/21 appt noted: Wyatt Gallegos no SE with meds. Overall doing ok with current meds. Still exercises playing tennis. Works hard [employer] 4 hours 5 nights per week.  Does his job well. At 9 mos gets benefits. Second job [employer]. D one more year of school. 66 yo. Plan: No med changes. Continue duloxetine 40, Vyvanse 70, clonazepam 0.5 mg BID and 1.0 mg HS, Spread out clonazepam.  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 His faith is still strong.   Edgy and more irritable with Adderall vs Vyvanse.  Lost temper on Adderall and felt out of control.  He still gets pt assistance from Vyvanse until about March. Still dealing with some depression Turning the curve emotionally with divorce.     Trhying not to worry over it.   Has some support at church. Wonders about changing antidepressant.   Still going to counseling.  Denies her alcohol or caffeine excess.  Past Psychiatric Medication Trials: Prozac, Zoloft, Lexapro fatigue.  Duloxetine 40, Wellbutrin 450, Buspar,  Concerta, Ritalin, Provigil, Vyvanse 70, modafinil,   Clonazepam 0.5 mg 4 tablets daily  Review of Systems:  Review of Systems  Constitutional:  Negative for fatigue.  Cardiovascular:  Negative for chest pain and palpitations.  Genitourinary:  Negative for decreased urine volume.       ED  Musculoskeletal:  Positive for arthralgias and myalgias.  Neurological:  Negative for tremors.  Psychiatric/Behavioral:  Positive for behavioral problems, decreased concentration and depression. Negative for agitation, confusion, dysphoric mood, hallucinations, self-injury, sleep disturbance and suicidal ideas. The patient is nervous/anxious. The patient  is not hyperactive.     Medications: I have reviewed the patient's current medications.  Current Outpatient Medications  Medication Sig Dispense Refill   clonazePAM (KLONOPIN) 0.5 MG tablet TAKE ONE TABLET BY MOUTH EVERY MORNING, TAKE ONE TABLET BY MOUTH EVERY AFTERNOON, AND TAKE TWO TABLETS BY MOUTH AT BEDTIME IF NEEDED 120 tablet 0   DULoxetine (CYMBALTA) 20 MG capsule Take 2 capsules (40 mg total) by mouth daily. 60 capsule 5   meloxicam (MOBIC) 15 MG tablet Take 15 mg by mouth daily.  0   metoprolol tartrate (LOPRESSOR) 25 MG tablet TAKE ONE TABLET BY MOUTH ONE TIME DAILY 90 tablet 0   sildenafil (VIAGRA) 100 MG tablet TAKE ONE TABLET BY MOUTH ONE TIME DAILY AS NEEDED FOR ERECTILE DYSFUNCTION 30 tablet 0   tadalafil (CIALIS) 5 MG tablet Take 1 tablet (5 mg total) by mouth daily in the afternoon. 30 tablet 2   tamsulosin (FLOMAX) 0.4 MG CAPS capsule Take 0.4 mg by mouth daily.  0   testosterone cypionate (DEPOTESTOSTERONE CYPIONATE) 200 MG/ML injection INJECT  INTO THE MUSCLE EVERY 14 DAYS     amphetamine-dextroamphetamine (ADDERALL XR) 25 MG 24 hr capsule Take 1 capsule by mouth daily. 30 capsule 0   gabapentin (NEURONTIN) 100 MG capsule Take 100 mg by mouth 3 (three) times daily. Mg unknown (Patient not taking: Reported on 11/22/2021)     hydrochlorothiazide (HYDRODIURIL) 25 MG tablet Take 25 mg by mouth daily.     lisdexamfetamine (VYVANSE) 40 MG capsule Take 1 capsule (40 mg total) by mouth every morning. (Patient not taking: Reported on 11/22/2021) 30 capsule 0   lisdexamfetamine (VYVANSE) 70 MG capsule Take 1 capsule (70 mg total) by mouth daily. (Patient not taking: Reported on 11/22/2021) 30 capsule 0   lisdexamfetamine (VYVANSE) 70 MG capsule Take 1 capsule (70 mg total) by mouth daily. 30 capsule 0   [START ON 12/20/2021] lisdexamfetamine (VYVANSE) 70 MG capsule Take 1 capsule (70 mg total) by mouth daily. 30 capsule 0   No current facility-administered medications for this visit.    Medication Side Effects: ? ED related  Allergies: No Known Allergies  History reviewed. No pertinent past medical history.  History reviewed. No pertinent family history.  Social History   Socioeconomic History   Marital status: Married    Spouse name: Not on file   Number of children: Not on file   Years of education: Not on file   Highest education level: Not on file  Occupational History   Not on file  Tobacco Use   Smoking status: Never   Smokeless tobacco: Never  Substance and Sexual Activity   Alcohol use: Never   Drug use: Never   Sexual activity: Not on file  Other Topics Concern   Not on file  Social History Narrative   Not on file   Social Determinants of Health   Financial Resource Strain: Not on file  Food Insecurity: Not on file  Transportation Needs: Not on file  Physical Activity: Not on file  Stress: Not on file  Social Connections: Not on file  Intimate Partner Violence: Not on file    Past Medical History,  Surgical history, Social history, and Family history were reviewed and updated as appropriate.   Please see review of systems for further details on the patient's review from today.   Objective:   Physical Exam:  There were no vitals taken for this visit.  Physical Exam Constitutional:      General: He  is not in acute distress. Musculoskeletal:        General: No deformity.  Neurological:     Mental Status: He is alert and oriented to person, place, and time.     Cranial Nerves: No dysarthria.     Coordination: Coordination normal.  Psychiatric:        Attention and Perception: Perception normal. He is inattentive. He does not perceive auditory or visual hallucinations.        Mood and Affect: Mood is anxious. Mood is not depressed. Affect is not labile, angry, tearful or inappropriate.        Speech: Speech normal.        Behavior: Behavior normal. Behavior is cooperative.        Thought Content: Thought content normal. Thought content is not paranoid or delusional. Thought content does not include homicidal or suicidal ideation. Thought content does not include suicidal plan.        Cognition and Memory: Cognition and memory normal.        Judgment: Judgment normal. Judgment is not inappropriate.     Comments: Insight good  Some chronic stress and anxiety Depression residual ongoing      Lab Review:     Component Value Date/Time   NA 136 07/04/2017 1551   K 3.8 07/04/2017 1551   CL 101 07/04/2017 1551   CO2 23 07/04/2017 1551   GLUCOSE 108 (H) 07/04/2017 1551   BUN 20 07/04/2017 1551   CREATININE 1.11 07/04/2017 1551   CALCIUM 9.5 07/04/2017 1551   GFRNONAA >60 07/04/2017 1551   GFRAA >60 07/04/2017 1551       Component Value Date/Time   WBC 6.5 07/04/2017 1551   RBC 5.33 07/04/2017 1551   HGB 16.3 07/04/2017 1551   HCT 48.5 07/04/2017 1551   PLT 205 07/04/2017 1551   MCV 91.1 07/04/2017 1551   MCH 30.6 07/04/2017 1551   MCHC 33.6 07/04/2017 1551   RDW 14.2  07/04/2017 1551    No results found for: "POCLITH", "LITHIUM"   No results found for: "PHENYTOIN", "PHENOBARB", "VALPROATE", "CBMZ"   .res Assessment: Plan:   Wyatt Gallegos was seen today for follow-up, adhd, depression and anxiety.  Diagnoses and all orders for this visit:  Depression, major, recurrent, in partial remission (HCC)  Attention deficit hyperactivity disorder (ADHD), predominantly inattentive type -     lisdexamfetamine (VYVANSE) 70 MG capsule; Take 1 capsule (70 mg total) by mouth daily. -     lisdexamfetamine (VYVANSE) 70 MG capsule; Take 1 capsule (70 mg total) by mouth daily. -     amphetamine-dextroamphetamine (ADDERALL XR) 25 MG 24 hr capsule; Take 1 capsule by mouth daily.  Panic disorder with agoraphobia  Generalized anxiety disorder  Insomnia due to mental condition  Erectile disorder, acquired, situational, severe    I asked about a change in antidepressant which is initiated he has discussed with his therapist.  He needs a generic medication if possible.  He has been having some increased problems with anxiety.  He also has problems with irritability when he takes Adderall XR 30 mg but not when he takes Vyvanse.  We discussed the options about switching antidepressants.  It would be best to use 1 that does have a good antianxiety effect but is not as likely to cause fatigue as the Lexapro he took in the past.  Therefore we will switch to low-dose sertraline 50 mg daily from Cymbalta  However he still needs the clonazepam as noted 0.5 mg tablets  1 every morning and 2 nightly for sleep and anxiety.  He is not ideal that he is taking clonazepam in the morning along with Vyvanse but his anxiety has been debilitating at times and has interfered with his ability to function and work settings and maintain employment.  Have not been able to manage the anxiety by usual alternative preferred methods having failed multiple SSRIs.  Discussed potential benefits, risks, and  side effects of stimulants with patient to include increased heart rate, palpitations, insomnia, increased anxiety, increased irritability, or decreased appetite.  Instructed patient to contact office if experiencing any significant tolerability issues. Still has sig ADD sx remaining and consider off label memantine.  We discussed the short-term risks associated with benzodiazepines including sedation and increased fall risk among others.  Discussed long-term side effect risk including dependence, potential withdrawal symptoms, and the potential eventual dose-related risk of dementia.   Disc risk dependence.  Not ideal to use Bz with stimulants but he's struggled with function without it.  Disc ED and tx and prostate.  Asks for RF Cialis.    Both duloxetine and potentially metoprolol could be contributing to some of his ED problems.  He wants to retry Viagra.  Sent  Disc chronic stress with $ and difficulty keeping a job ongoing.   Supportive therapy dealing with his loneliness and addressing self-destructive behavior out of it.   Supportive therapy dealing with work stress.  He's continued to have a lot of job instability which is a chronic stressor. Take advantage of faith resources.   Vyvanse 70, clonazepam 0.5 mg BID and 1.0 mg HS, Spread out clonazepam. If can't get Vyvanse DT shortage then reduce Adderall XR from 30 to 25 mg daily. Ok daily cialis 5 mg daily bc ED and prostate issues Daily 1/2 of 10 mg Cialis tablet daily Start sertraline 1/2 tablet daily and reduce duloxetine to 1 daily for 1 week, Then stop duloxetine and increase sertraline to 1 tablet daily  FU 2 mos  Meredith Staggers, MD, DFAPA    Please see After Visit Summary for patient specific instructions.  No future appointments.  No orders of the defined types were placed in this encounter.     -------------------------------

## 2021-12-09 ENCOUNTER — Other Ambulatory Visit: Payer: Self-pay | Admitting: Psychiatry

## 2021-12-09 DIAGNOSIS — F3341 Major depressive disorder, recurrent, in partial remission: Secondary | ICD-10-CM

## 2021-12-09 DIAGNOSIS — F411 Generalized anxiety disorder: Secondary | ICD-10-CM

## 2021-12-09 DIAGNOSIS — F4001 Agoraphobia with panic disorder: Secondary | ICD-10-CM

## 2021-12-10 NOTE — Telephone Encounter (Signed)
Filled 11/7 appt 1/31

## 2021-12-22 ENCOUNTER — Other Ambulatory Visit: Payer: Self-pay | Admitting: Psychiatry

## 2022-02-02 ENCOUNTER — Other Ambulatory Visit: Payer: Self-pay | Admitting: Psychiatry

## 2022-02-02 DIAGNOSIS — N529 Male erectile dysfunction, unspecified: Secondary | ICD-10-CM

## 2022-02-06 ENCOUNTER — Ambulatory Visit: Payer: Self-pay | Admitting: Psychiatry

## 2022-02-11 ENCOUNTER — Ambulatory Visit (INDEPENDENT_AMBULATORY_CARE_PROVIDER_SITE_OTHER): Payer: BC Managed Care – PPO | Admitting: Psychiatry

## 2022-02-11 ENCOUNTER — Encounter: Payer: Self-pay | Admitting: Psychiatry

## 2022-02-11 DIAGNOSIS — F5105 Insomnia due to other mental disorder: Secondary | ICD-10-CM

## 2022-02-11 DIAGNOSIS — F900 Attention-deficit hyperactivity disorder, predominantly inattentive type: Secondary | ICD-10-CM

## 2022-02-11 DIAGNOSIS — F3341 Major depressive disorder, recurrent, in partial remission: Secondary | ICD-10-CM | POA: Diagnosis not present

## 2022-02-11 DIAGNOSIS — N529 Male erectile dysfunction, unspecified: Secondary | ICD-10-CM

## 2022-02-11 DIAGNOSIS — F4001 Agoraphobia with panic disorder: Secondary | ICD-10-CM | POA: Diagnosis not present

## 2022-02-11 DIAGNOSIS — F411 Generalized anxiety disorder: Secondary | ICD-10-CM

## 2022-02-11 MED ORDER — LISDEXAMFETAMINE DIMESYLATE 70 MG PO CAPS: 70.0000 mg | ORAL_CAPSULE | Freq: Every day | ORAL | 0 refills | Status: DC

## 2022-02-11 MED ORDER — SERTRALINE HCL 50 MG PO TABS: 50.0000 mg | ORAL_TABLET | Freq: Every day | ORAL | 0 refills | Status: DC

## 2022-02-11 NOTE — Progress Notes (Signed)
Wyatt Gallegos [id number] 05/24/1955 67 y.o.     Subjective:   Patient ID:  Wyatt Gallegos is a 67 y.o. (DOB [birth year]) male.  Chief Complaint:  Chief Complaint  Patient presents with   Follow-up   Depression   ADHD   Anxiety    Anxiety Symptoms include decreased concentration and nervous/anxious behavior. Patient reports no chest pain, confusion, palpitations or suicidal ideas.    Depression        Associated symptoms include decreased concentration and myalgias.  Associated symptoms include no fatigue and no suicidal ideas.  Past medical history includes anxiety.    Wyatt Gallegos presents today for follow-up of depression, anxiety, and ADD HD.  He has been under my care for many years.  At visit May 2020 and was fatigue and we reduced Lexapro to help.  It did not help so we switched to duloxetine  40 and it helped.  He has some chronic anxiety and was taking clonazepam at night and half of a clonazepam tablet in the morning for sleep and anxiety respectively   08/31/19 appt with the following noted: On 40 mg duloxetine, Vyvanse 70, clonazepam 0.5 mg prn. Took [employer] job but too far in [place].  [employer] next job unloading trucks.  Not consistent hours so poor pay.   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  Then on with [employer] in [place] but fired in 2 days bc he wasn't fast enough. 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  Has a potential at [employer]. Trying to stay positive. Overall really pleased.  Still pleased with duloxetine and feels like his normal self.  Patient reports stable mood and denies depressed or irritable moods.  Patient denies any recent difficulty with anxiety.  Patient denies difficulty with sleep initiation or maintenance. Denies appetite disturbance.  Patient reports that energy and motivation have been good.  Patient denies any difficulty with concentration.   Patient denies any suicidal ideation. Very tense if runs out of clonazepam. Still doing counseling.  Exercising 3 times weekly. Clonazepam really helped take the edge off anxiety during the day and could focus better bc less anxiety.  Taking 0.5 mg AM and 1.0 mg HS.  Keeps anxiety from paralyzing him.  Getting sleep.  Wants to take it twice daily.  Sleep well with Klonopin. Still benefit with Vyvanse for focus. Plan: no med changes  10/22/2019 telephone call from patient requesting early refill by 2 weeks on clonazepam that was refused.  Patient was warned not to exceed recommended dosing on controlled substance or it would be discontinued.  12/20/19 appt with following noted: PDMP reviewed and shows Vyvanse 70 mg filled on a regular schedule.  The last clonazepam 0.5 mg tablets was filled on 11/07/2019 420 tablets. Says he lost the clonazepam RX.  Was in a lot of social turmoil and unrest in living situation.  Says 4 weeks later he found it.  Denies he was overtaking the med.   Situation with exW upsetting bc he feels she left him over money.  Still grief though has done grief work in grief share.  Recognizes chronic job instability. Just got suspended from [employer] working 15 hours days.   Over trivial matter of being falsely accused of sexual harassment.  Works hard all day.   He called HR and they said they are investigating it.   Feels he needs the clonazepam and  the Vyvanse and benefits from duloxetine. Plan: No med changes. Continue duloxetine 40, Vyvanse 70, clonazepam 0.5 mg BID and 1.0 mg HS,   04/03/2020 appointment with following noted: D accepted to [employer] school. New job better pay.    Getting Vyvanse through the program.   $ stress with [employer] cutting back hours.  Was hard on his R knee.  Better without the job. Plan no med changes  08/21/20 appt noted: Has remained on Vyvanse with Pt Assistance. Lonely through [place] and feeling sucked into a hole by a woman.  Getting  scammed.  Trying to get out of the scam.    Has talked to men at church about it.  While up in [place] to deal with it got pulled over and car impounded for not paying a ticket.  Had to walk 20 miles and hitchhiked.  Nephew helped him out spent $1200 to help pt.  Has asked others for money after giving scammer money.   Still losing and changing jobs. Lost another job and Covid has hurt him and falsely accused of something at [facility] and lost a month of income and then cleared in investigation. Plan: No med changes. Continue duloxetine 40, Vyvanse 70, clonazepam 0.5 mg BID and 1.0 mg HS, Spread out clonazepam.  11/21/2020 appointment with the following noted: Wyatt Gallegos Applying for social security DT age.  Hasn't been able to be successful at keeping a FT job in spite of repeated attempts over many years.  MD supports this and he's aware. D scholarship in medical field. Consistent with meds: duloxetine 40, Vyvanse 70, gabapentin 100 TID, clonazepam 0.5 BID and 1.0 mg HS prn. Ex wife is not always hostile. States again he didn't over take clonazepam but lost a bottle. Involved in men's group at church. Plan: No med changes. Continue duloxetine 40, Vyvanse 70, clonazepam 0.5 mg BID and 1.0 mg HS, Spread out clonazepam.  05/21/21 appt noted: Wyatt Gallegos no SE with meds. Overall doing ok with current meds. Still exercises playing tennis. Works hard [employer] 4 hours 5 nights per week.  Does his job well. At 9 mos gets benefits. Second job [employer]. D one more year of school. 67 yo. Plan: No med changes. Continue duloxetine 40, Vyvanse 70, clonazepam 0.5 mg BID and 1.0 mg HS, Spread out clonazepam.  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  Wyatt Gallegos list.  4 years of academic scholarship. His faith is still strong.   Edgy and more irritable with Adderall vs Vyvanse.  Lost temper on Adderall and felt out of control.  He still gets pt assistance from Vyvanse until about March. Still dealing with some  depression Turning the curve emotionally with divorce.    Trhying not to worry over it.   Has some support at church. Wonders about changing antidepressant. Plan:  Vyvanse 70, clonazepam 0.5 mg BID and 1.0 mg HS, Spread out clonazepam. If can't get Vyvanse DT shortage then reduce Adderall XR from 30 to 25 mg daily. Ok daily cialis 5 mg daily bc ED and prostate issues Daily 1/2 of 10 mg Cialis tablet daily Start sertraline 1/2 tablet daily and reduce duloxetine to 1 daily for 1 week, Then stop duloxetine and increase sertraline to 1 tablet daily  02/11/2022 appointment noted: Doing well overall.  More balanced.  Less lonely.  Met someone at church through a friend.  She's a Marine scientist and a [place] and they've done Bible studies. Switched from duloxetine to sertraline. Energy is good.  Depression  generally managed.  Busy and active.  No major anxiety except situationally.   Working at [employer].   Will play tennis. Has a roommate who plays tennis too. Still Christian men's Bible study.   M history leukemia and died at his 65 yo.  Grew up with anxiety over her dying.  He was the youngest.  Still going to counseling.  Denies her alcohol or caffeine excess.  Past Psychiatric Medication Trials: Prozac, Zoloft, Lexapro fatigue.  Duloxetine 40, Wellbutrin 324, Buspar,  Concerta, Ritalin, Provigil, Vyvanse 70, modafinil,   Clonazepam 0.5 mg 4 tablets daily  Review of Systems:  Review of Systems  Constitutional:  Negative for fatigue.  Cardiovascular:  Negative for chest pain and palpitations.  Genitourinary:  Negative for decreased urine volume.       ED  Musculoskeletal:  Positive for arthralgias and myalgias.  Neurological:  Negative for tremors.  Psychiatric/Behavioral:  Positive for decreased concentration. Negative for agitation, behavioral problems, confusion, dysphoric mood, hallucinations, self-injury, sleep disturbance and suicidal ideas. The patient is nervous/anxious. The patient is  not hyperactive.     Medications: I have reviewed the patient's current medications.  Current Outpatient Medications  Medication Sig Dispense Refill   clonazePAM (KLONOPIN) 0.5 MG tablet TAKE ONE TABLET BY MOUTH EVERY MORNING, ONE EVERY AFTERNOON, AND TAKE TWO TABLETS BY MOUTH AT BEDTIME AS NEEDED 120 tablet 2   hydrochlorothiazide (HYDRODIURIL) 25 MG tablet Take 25 mg by mouth daily.     meloxicam (MOBIC) 15 MG tablet Take 15 mg by mouth daily.  0   metoprolol tartrate (LOPRESSOR) 25 MG tablet TAKE ONE TABLET BY MOUTH ONE TIME DAILY 90 tablet 0   tadalafil (CIALIS) 5 MG tablet TAKE ONE TABLET BY MOUTH ONE TIME DAILY IN THE AFTERNOON 30 tablet 2   tamsulosin (FLOMAX) 0.4 MG CAPS capsule Take 0.4 mg by mouth daily.  0   testosterone cypionate (DEPOTESTOSTERONE CYPIONATE) 200 MG/ML injection INJECT 1ML INTO THE MUSCLE EVERY 14 DAYS     amphetamine-dextroamphetamine (ADDERALL XR) 25 MG 24 hr capsule Take 1 capsule by mouth daily. (Patient not taking: Reported on 02/11/2022) 30 capsule 0   gabapentin (NEURONTIN) 100 MG capsule Take 100 mg by mouth 3 (three) times daily. Mg unknown (Patient not taking: Reported on 11/22/2021)     lisdexamfetamine (VYVANSE) 70 MG capsule Take 1 capsule (70 mg total) by mouth daily. 30 capsule 0   [START ON 03/11/2022] lisdexamfetamine (VYVANSE) 70 MG capsule Take 1 capsule (70 mg total) by mouth daily. 30 capsule 0   [START ON 04/08/2022] lisdexamfetamine (VYVANSE) 70 MG capsule Take 1 capsule (70 mg total) by mouth daily. 30 capsule 0   sertraline (ZOLOFT) 50 MG tablet Take 1 tablet (50 mg total) by mouth daily. 90 tablet 0   sildenafil (VIAGRA) 100 MG tablet TAKE ONE TABLET BY MOUTH ONE TIME DAILY AS NEEDED FOR ERECTILE DYSFUNCTION (Patient not taking: Reported on 02/11/2022) 30 tablet 0   No current facility-administered medications for this visit.    Medication Side Effects: ? ED related  Allergies: No Known Allergies  History reviewed. No pertinent past medical  history.  History reviewed. No pertinent family history.  Social History   Socioeconomic History   Marital status: Married    Spouse name: Not on file   Number of children: Not on file   Years of education: Not on file   Highest education level: Not on file  Occupational History   Not on file  Tobacco Use   Smoking status:  Never   Smokeless tobacco: Never  Substance and Sexual Activity   Alcohol use: Never   Drug use: Never   Sexual activity: Not on file  Other Topics Concern   Not on file  Social History Narrative   Not on file   Social Determinants of Health   Financial Resource Strain: Not on file  Food Insecurity: Not on file  Transportation Needs: Not on file  Physical Activity: Not on file  Stress: Not on file  Social Connections: Not on file  Intimate Partner Violence: Not on file    Past Medical History, Surgical history, Social history, and Family history were reviewed and updated as appropriate.   Please see review of systems for further details on the patient's review from today.   Objective:   Physical Exam:  There were no vitals taken for this visit.  Physical Exam Constitutional:      General: He is not in acute distress. Musculoskeletal:        General: No deformity.  Neurological:     Mental Status: He is alert and oriented to person, place, and time.     Cranial Nerves: No dysarthria.     Coordination: Coordination normal.  Psychiatric:        Attention and Perception: Perception normal. He is inattentive. He does not perceive auditory or visual hallucinations.        Mood and Affect: Mood is anxious. Mood is not depressed. Affect is not labile, angry or tearful.        Speech: Speech normal.        Behavior: Behavior normal. Behavior is cooperative.        Thought Content: Thought content normal. Thought content is not paranoid or delusional. Thought content does not include homicidal or suicidal ideation. Thought content does not include  suicidal plan.        Cognition and Memory: Cognition and memory normal.        Judgment: Judgment normal. Judgment is not inappropriate.     Comments: Insight good  Some chronic stress and anxiety but better at present. Depression residual better.   Talkative and pleasant.     Lab Review:     Component Value Date/Time   NA 136 07/04/2017 1551   K 3.8 07/04/2017 1551   CL 101 07/04/2017 1551   CO2 23 07/04/2017 1551   GLUCOSE 108 (H) 07/04/2017 1551   BUN 20 07/04/2017 1551   CREATININE 1.11 07/04/2017 1551   CALCIUM 9.5 07/04/2017 1551   GFRNONAA >60 07/04/2017 1551   GFRAA >60 07/04/2017 1551       Component Value Date/Time   WBC 6.5 07/04/2017 1551   RBC 5.33 07/04/2017 1551   HGB 16.3 07/04/2017 1551   HCT 48.5 07/04/2017 1551   PLT 205 07/04/2017 1551   MCV 91.1 07/04/2017 1551   MCH 30.6 07/04/2017 1551   MCHC 33.6 07/04/2017 1551   RDW 14.2 07/04/2017 1551    No results found for: "POCLITH", "LITHIUM"   No results found for: "PHENYTOIN", "PHENOBARB", "VALPROATE", "CBMZ"   .res Assessment: Plan:   [place] was seen today for follow-up, depression, adhd and anxiety.  Diagnoses and all orders for this visit:  Depression, major, recurrent, in partial remission (HCC) -     sertraline (ZOLOFT) 50 MG tablet; Take 1 tablet (50 mg total) by mouth daily.  Attention deficit hyperactivity disorder (ADHD), predominantly inattentive type -     lisdexamfetamine (VYVANSE) 70 MG capsule; Take 1 capsule (70 mg  total) by mouth daily. -     lisdexamfetamine (VYVANSE) 70 MG capsule; Take 1 capsule (70 mg total) by mouth daily. -     lisdexamfetamine (VYVANSE) 70 MG capsule; Take 1 capsule (70 mg total) by mouth daily.  Panic disorder with agoraphobia -     sertraline (ZOLOFT) 50 MG tablet; Take 1 tablet (50 mg total) by mouth daily.  Generalized anxiety disorder -     sertraline (ZOLOFT) 50 MG tablet; Take 1 tablet (50 mg total) by mouth daily.  Insomnia due to mental  condition  Erectile disorder, acquired, situational, severe  Greater than 50% of 30 min face to face time with patient was spent on counseling and coordination of care. We discussed we switched to sertraline and he's satisfied.Done well with switch to low-dose sertraline 50 mg daily from Cymbalta  He also has problems with irritability when he takes Adderall XR 30 mg but not when he takes Vyvanse.    However he still needs the clonazepam as noted 0.5 mg tablets 1 every morning and 2 nightly for sleep and anxiety.  He is not ideal that he is taking clonazepam in the morning along with Vyvanse but his anxiety has been debilitating at times and has interfered with his ability to function and work settings and maintain employment.  Have not been able to manage the anxiety by usual alternative preferred methods having failed multiple SSRIs.  Discussed potential benefits, risks, and side effects of stimulants with patient to include increased heart rate, palpitations, insomnia, increased anxiety, increased irritability, or decreased appetite.  Instructed patient to contact office if experiencing any significant tolerability issues. Still has sig ADD sx remaining and consider off label memantine.  We discussed the short-term risks associated with benzodiazepines including sedation and increased fall risk among others.  Discussed long-term side effect risk including dependence, potential withdrawal symptoms, and the potential eventual dose-related risk of dementia.   Disc risk dependence.  Not ideal to use Bz with stimulants but he's struggled with function without it.  Disc ED and tx and prostate.  Asks for RF Cialis.    Both duloxetine and potentially metoprolol could be contributing to some of his ED problems.  He wants to retry Viagra.  Sent  Disc chronic stress with $ and difficulty keeping a job ongoing.   Supportive therapy dealing with his loneliness and addressing self-destructive behavior out of  it.   Supportive therapy dealing with work stress.  He's continued to have a lot of job instability which is a chronic stressor. Take advantage of faith resources.   Vyvanse 70, clonazepam 0.5 mg BID and 1.0 mg HS, Spread out clonazepam. If can't get Vyvanse DT shortage then reduce Adderall XR from 30 to 25 mg daily. Ok daily cialis 5 mg daily bc ED and prostate issues Daily 1/2 of 10 mg Cialis tablet daily continue sertraline 50 mg daily   FU 2 mos  Lynder Parents, MD, DFAPA    Please see After Visit Summary for patient specific instructions.  No future appointments.  No orders of the defined types were placed in this encounter.     -------------------------------

## 2022-02-28 ENCOUNTER — Other Ambulatory Visit: Payer: Self-pay | Admitting: Psychiatry

## 2022-02-28 DIAGNOSIS — F3341 Major depressive disorder, recurrent, in partial remission: Secondary | ICD-10-CM

## 2022-02-28 DIAGNOSIS — F411 Generalized anxiety disorder: Secondary | ICD-10-CM

## 2022-02-28 DIAGNOSIS — F4001 Agoraphobia with panic disorder: Secondary | ICD-10-CM

## 2022-04-16 ENCOUNTER — Other Ambulatory Visit: Payer: Self-pay | Admitting: Psychiatry

## 2022-04-16 DIAGNOSIS — F4001 Agoraphobia with panic disorder: Secondary | ICD-10-CM

## 2022-04-16 DIAGNOSIS — F3341 Major depressive disorder, recurrent, in partial remission: Secondary | ICD-10-CM

## 2022-04-16 DIAGNOSIS — F411 Generalized anxiety disorder: Secondary | ICD-10-CM

## 2022-04-25 ENCOUNTER — Other Ambulatory Visit: Payer: Self-pay | Admitting: Psychiatry

## 2022-04-25 ENCOUNTER — Telehealth: Payer: Self-pay | Admitting: Psychiatry

## 2022-04-25 NOTE — Telephone Encounter (Signed)
Let pharmacist know he can pick it up Friday 04/26/22 and call the pt and tell him he's got to stick to the 4 /day limit as written of clonazepam.

## 2022-04-25 NOTE — Telephone Encounter (Signed)
Pharmacy Lvm @ 1:10p.  Tristen, the pharmacist, says pt is wanting an early refill of Clonazepam 0.5mg . Last filled 3/22.  They will normally do 2 days early, but pt wants it today.  They want approval from Dr Jennelle Human.  Next appt 8/5

## 2022-04-26 NOTE — Telephone Encounter (Signed)
Called pharmacy and patient had picked up RF today. LVM to [facility] for patient.

## 2022-04-29 NOTE — Telephone Encounter (Signed)
Left second VM to RC.  

## 2022-05-22 ENCOUNTER — Other Ambulatory Visit: Payer: Self-pay | Admitting: Psychiatry

## 2022-05-22 DIAGNOSIS — F900 Attention-deficit hyperactivity disorder, predominantly inattentive type: Secondary | ICD-10-CM

## 2022-05-22 MED ORDER — LISDEXAMFETAMINE DIMESYLATE 70 MG PO CAPS
70.0000 mg | ORAL_CAPSULE | Freq: Every day | ORAL | 0 refills | Status: DC
Start: 2022-06-19 — End: 2022-09-24

## 2022-05-22 MED ORDER — LISDEXAMFETAMINE DIMESYLATE 70 MG PO CAPS
70.0000 mg | ORAL_CAPSULE | Freq: Every day | ORAL | 0 refills | Status: DC
Start: 2022-05-22 — End: 2022-09-24

## 2022-05-22 MED ORDER — LISDEXAMFETAMINE DIMESYLATE 70 MG PO CAPS
70.0000 mg | ORAL_CAPSULE | Freq: Every day | ORAL | 0 refills | Status: DC
Start: 2022-07-17 — End: 2022-09-24

## 2022-05-24 ENCOUNTER — Other Ambulatory Visit: Payer: Self-pay | Admitting: Psychiatry

## 2022-05-24 DIAGNOSIS — F411 Generalized anxiety disorder: Secondary | ICD-10-CM

## 2022-05-24 DIAGNOSIS — F3341 Major depressive disorder, recurrent, in partial remission: Secondary | ICD-10-CM

## 2022-05-24 DIAGNOSIS — F4001 Agoraphobia with panic disorder: Secondary | ICD-10-CM

## 2022-05-31 ENCOUNTER — Ambulatory Visit (INDEPENDENT_AMBULATORY_CARE_PROVIDER_SITE_OTHER): Payer: BC Managed Care – PPO

## 2022-05-31 ENCOUNTER — Ambulatory Visit (INDEPENDENT_AMBULATORY_CARE_PROVIDER_SITE_OTHER): Payer: BC Managed Care – PPO | Admitting: Podiatry

## 2022-05-31 DIAGNOSIS — M79671 Pain in right foot: Secondary | ICD-10-CM

## 2022-05-31 NOTE — Progress Notes (Signed)
   Chief Complaint  Patient presents with   Nail Problem    Patient came in today for bilateral hallux deformity, patient plays tennis and nails have fell off multiple times, nails are thick and yellow,    Foot Pain    Lateral side of the right side of foot,     HPI: 67 y.o. male presenting today as a new patient for evaluation of nail dystrophy to the bilateral great toes.  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  He even played in college.  He has a history of repetitive trauma to the bilateral toenails.  No past medical history on file.  Past Surgical History:  Procedure Laterality Date   ESOPHAGOGASTRODUODENOSCOPY (EGD) WITH PROPOFOL N/A 07/04/2017   Procedure: ESOPHAGOGASTRODUODENOSCOPY (EGD) WITH PROPOFOL;  Surgeon: Midge Minium, MD;  Location: [facility] ENDOSCOPY;  Service: Endoscopy;  Laterality: N/A;    No Known Allergies   Physical Exam: General: The patient is alert and oriented x3 in no acute distress.  Dermatology: Skin is warm, dry and supple bilateral lower extremities.  There is some hyperkeratotic dystrophy noted to the bilateral great toenails.  No subungual debris or friability of the nail plate  Vascular: Palpable pedal pulses bilaterally. Capillary refill within normal limits.  No appreciable edema.  No erythema.  Neurological: Grossly intact via light touch  Musculoskeletal Exam: No pedal deformities noted  Radiographic Exam B/L feet 05/31/2022:  Normal osseous mineralization. Joint spaces preserved.  No fractures or osseous irregularities noted.  Impression: Negative  Assessment/Plan of Care: 1.  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  I do not believe that the patient is experiencing a true fungal nail infection.  Given his history of sports and tenderness for several years I do believe he is dealing with repetitive microtrauma causing  nail dystrophy -The nails were debrided today and the patient did feel some relief.  Smoothed with a rotary bur -Recommend OTC urea 40% nail gel to soften the nail plate -Return to clinic as needed       Felecia Shelling, DPM [facility]  Dr. Felecia Shelling, DPM    [address] [place], [place] [address]                Office [phone]  Fax [phone]

## 2022-06-07 ENCOUNTER — Other Ambulatory Visit: Payer: Self-pay | Admitting: Podiatry

## 2022-06-07 DIAGNOSIS — M79671 Pain in right foot: Secondary | ICD-10-CM

## 2022-07-17 ENCOUNTER — Other Ambulatory Visit: Payer: Self-pay | Admitting: Psychiatry

## 2022-07-17 DIAGNOSIS — F411 Generalized anxiety disorder: Secondary | ICD-10-CM

## 2022-07-17 DIAGNOSIS — F3341 Major depressive disorder, recurrent, in partial remission: Secondary | ICD-10-CM

## 2022-07-17 DIAGNOSIS — F4001 Agoraphobia with panic disorder: Secondary | ICD-10-CM

## 2022-08-12 ENCOUNTER — Ambulatory Visit: Payer: BC Managed Care – PPO | Admitting: Psychiatry

## 2022-08-22 ENCOUNTER — Other Ambulatory Visit: Payer: Self-pay | Admitting: Psychiatry

## 2022-08-22 DIAGNOSIS — F3341 Major depressive disorder, recurrent, in partial remission: Secondary | ICD-10-CM

## 2022-08-22 DIAGNOSIS — F411 Generalized anxiety disorder: Secondary | ICD-10-CM

## 2022-08-22 DIAGNOSIS — F4001 Agoraphobia with panic disorder: Secondary | ICD-10-CM

## 2022-09-03 ENCOUNTER — Other Ambulatory Visit: Payer: Self-pay | Admitting: Psychiatry

## 2022-09-03 DIAGNOSIS — F3341 Major depressive disorder, recurrent, in partial remission: Secondary | ICD-10-CM

## 2022-09-03 DIAGNOSIS — F411 Generalized anxiety disorder: Secondary | ICD-10-CM

## 2022-09-03 DIAGNOSIS — F4001 Agoraphobia with panic disorder: Secondary | ICD-10-CM

## 2022-09-08 ENCOUNTER — Other Ambulatory Visit: Payer: Self-pay | Admitting: Psychiatry

## 2022-09-08 DIAGNOSIS — N529 Male erectile dysfunction, unspecified: Secondary | ICD-10-CM

## 2022-09-20 ENCOUNTER — Other Ambulatory Visit: Payer: Self-pay | Admitting: Psychiatry

## 2022-09-20 DIAGNOSIS — F3341 Major depressive disorder, recurrent, in partial remission: Secondary | ICD-10-CM

## 2022-09-20 DIAGNOSIS — F4001 Agoraphobia with panic disorder: Secondary | ICD-10-CM

## 2022-09-20 DIAGNOSIS — F411 Generalized anxiety disorder: Secondary | ICD-10-CM

## 2022-09-23 ENCOUNTER — Telehealth: Payer: Self-pay | Admitting: Psychiatry

## 2022-09-23 ENCOUNTER — Other Ambulatory Visit: Payer: Self-pay

## 2022-09-23 DIAGNOSIS — F4001 Agoraphobia with panic disorder: Secondary | ICD-10-CM

## 2022-09-23 DIAGNOSIS — F900 Attention-deficit hyperactivity disorder, predominantly inattentive type: Secondary | ICD-10-CM

## 2022-09-23 DIAGNOSIS — F3341 Major depressive disorder, recurrent, in partial remission: Secondary | ICD-10-CM

## 2022-09-23 DIAGNOSIS — F411 Generalized anxiety disorder: Secondary | ICD-10-CM

## 2022-09-23 MED ORDER — CLONAZEPAM 0.5 MG PO TABS
ORAL_TABLET | ORAL | 0 refills | Status: DC
Start: 2022-09-23 — End: 2022-09-24

## 2022-09-23 NOTE — Telephone Encounter (Signed)
Pended.

## 2022-09-23 NOTE — Telephone Encounter (Signed)
Patient is requesting to pick up his Klonopin Rx today. He is aware of an appointment on tomorrow, but stated he has been without medication. Contact [phone]

## 2022-09-24 ENCOUNTER — Ambulatory Visit (INDEPENDENT_AMBULATORY_CARE_PROVIDER_SITE_OTHER): Payer: BC Managed Care – PPO | Admitting: Psychiatry

## 2022-09-24 ENCOUNTER — Encounter: Payer: Self-pay | Admitting: Psychiatry

## 2022-09-24 DIAGNOSIS — F4001 Agoraphobia with panic disorder: Secondary | ICD-10-CM | POA: Diagnosis not present

## 2022-09-24 DIAGNOSIS — F3341 Major depressive disorder, recurrent, in partial remission: Secondary | ICD-10-CM

## 2022-09-24 DIAGNOSIS — F411 Generalized anxiety disorder: Secondary | ICD-10-CM

## 2022-09-24 DIAGNOSIS — F900 Attention-deficit hyperactivity disorder, predominantly inattentive type: Secondary | ICD-10-CM | POA: Diagnosis not present

## 2022-09-24 DIAGNOSIS — F5105 Insomnia due to other mental disorder: Secondary | ICD-10-CM

## 2022-09-24 MED ORDER — SERTRALINE HCL 50 MG PO TABS
50.0000 mg | ORAL_TABLET | Freq: Every day | ORAL | 1 refills | Status: DC
Start: 2022-09-24 — End: 2023-04-01

## 2022-09-24 MED ORDER — LISDEXAMFETAMINE DIMESYLATE 70 MG PO CAPS: 70.0000 mg | ORAL_CAPSULE | Freq: Every day | ORAL | 0 refills | Status: DC

## 2022-09-24 MED ORDER — CLONAZEPAM 0.5 MG PO TABS
0.5000 mg | ORAL_TABLET | Freq: Four times a day (QID) | ORAL | 5 refills | Status: DC
Start: 2022-09-24 — End: 2023-03-13

## 2022-09-24 MED ORDER — LISDEXAMFETAMINE DIMESYLATE 70 MG PO CAPS
70.0000 mg | ORAL_CAPSULE | Freq: Every day | ORAL | 0 refills | Status: DC
Start: 2022-09-24 — End: 2023-05-14

## 2022-09-24 NOTE — Progress Notes (Signed)
Wyatt Gallegos [id number] 05/11/1955 67 y.o.     Subjective:   Patient ID:  Wyatt Gallegos is a 67 y.o. (DOB 1955/08/19) male.  Chief Complaint:  Chief Complaint  Patient presents with   Follow-up   Anxiety   ADD    Anxiety Symptoms include decreased concentration and nervous/anxious behavior. Patient reports no confusion, palpitations or suicidal ideas.    Depression        Associated symptoms include decreased concentration and myalgias.  Associated symptoms include no fatigue and no suicidal ideas.  Past medical history includes anxiety.    Wyatt Gallegos presents today for follow-up of depression, anxiety, and ADD HD.  He has been under my care for many years.  At visit May 2020 and was fatigue and we reduced Lexapro to help.  It did not help so we switched to duloxetine  40 and it helped.  He has some chronic anxiety and was taking clonazepam at night and half of a clonazepam tablet in the morning for sleep and anxiety respectively   08/31/19 appt with the following noted: On 40 mg duloxetine, Vyvanse 70, clonazepam 0.5 mg prn. Took [employer] job but too far in [place].  [employer] next job unloading trucks.  Not consistent hours so poor pay.   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  Then on with [employer] in [place] but fired in 2 days bc he wasn't fast enough. 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  Has a potential at [employer]. Trying to stay positive. Overall really pleased.  Still pleased with duloxetine and feels like his normal self.  Patient reports stable mood and denies depressed or irritable moods.  Patient denies any recent difficulty with anxiety.  Patient denies difficulty with sleep initiation or maintenance. Denies appetite disturbance.  Patient reports that energy and motivation have been good.  Patient denies any difficulty with concentration.  Patient denies any suicidal  ideation. Very tense if runs out of clonazepam. Still doing counseling.  Exercising 3 times weekly. Clonazepam really helped take the edge off anxiety during the day and could focus better bc less anxiety.  Taking 0.5 mg AM and 1.0 mg HS.  Keeps anxiety from paralyzing him.  Getting sleep.  Wants to take it twice daily.  Sleep well with Klonopin. Still benefit with Vyvanse for focus. Plan: no med changes  10/22/2019 telephone call from patient requesting early refill by 2 weeks on clonazepam that was refused.  Patient was warned not to exceed recommended dosing on controlled substance or it would be discontinued.  12/20/19 appt with following noted: PDMP reviewed and shows Vyvanse 70 mg filled on a regular schedule.  The last clonazepam 0.5 mg tablets was filled on 11/07/2019 420 tablets. Says he lost the clonazepam RX.  Was in a lot of social turmoil and unrest in living situation.  Says 4 weeks later he found it.  Denies he was overtaking the med.   Situation with exW upsetting bc he feels she left him over money.  Still grief though has done grief work in grief share.  Recognizes chronic job instability. Just got suspended from [employer] working 15 hours days.   Over trivial matter of being falsely accused of sexual harassment.  Works hard all day.   He called HR and they said they are investigating it.   Feels he needs the clonazepam and the Vyvanse and benefits from  duloxetine. Plan: No med changes. 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 New job better pay.    Getting Vyvanse through the program.   $ stress with [employer] cutting back hours.  Was hard on his R knee.  Better without the job. Plan no med changes  08/21/20 appt noted: Has remained on Vyvanse with Pt Assistance. Lonely through [place] and feeling sucked into a hole by a woman.  Getting scammed.  Trying to get out of  the scam.    Has talked to men at church about it.  While up in [place] to deal with it got pulled over and car impounded for not paying a ticket.  Had to walk 20 miles and hitchhiked.  Nephew helped him out spent $1200 to help pt.  Has asked others for money after giving scammer money.   Still losing and changing jobs. Lost another job and Covid has hurt him and falsely accused of something at [facility] and lost a month of income and then cleared in investigation. Plan: No med changes. Continue duloxetine 40, Vyvanse 70, clonazepam 0.5 mg BID and 1.0 mg HS, Spread out clonazepam.  11/21/2020 appointment with the following noted: Wyatt Gallegos Applying for social security DT age.  Hasn't been able to be successful at keeping a FT job in spite of repeated attempts over many years.  MD supports this and he's aware. D scholarship in medical field. Consistent with meds: duloxetine 40, Vyvanse 70, gabapentin 100 TID, clonazepam 0.5 BID and 1.0 mg HS prn. Ex wife is not always hostile. States again he didn't over take clonazepam but lost a bottle. Involved in men's group at church. Plan: No med changes. Continue duloxetine 40, Vyvanse 70, clonazepam 0.5 mg BID and 1.0 mg HS, Spread out clonazepam.  05/21/21 appt noted: Wyatt Gallegos no SE with meds. Overall doing ok with current meds. Still exercises playing tennis. Works hard [employer] 4 hours 5 nights per week.  Does his job well. At 9 mos gets benefits. Second job [employer]. D one more year of school. 67 yo. Plan: No med changes. Continue duloxetine 40, Vyvanse 70, clonazepam 0.5 mg BID and 1.0 mg HS, Spread out clonazepam.  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 His faith is still strong.   Edgy and more irritable with Adderall vs Vyvanse.  Lost temper on Adderall and felt out of control.  He still gets pt assistance from Vyvanse until about March. Still dealing with some depression [place]  emotionally with divorce.    Trhying not to worry over it.   Has some support at church. Wonders about changing antidepressant. Plan:  Vyvanse 70, clonazepam 0.5 mg BID and 1.0 mg HS, Spread out clonazepam. If can't get Vyvanse DT shortage then reduce Adderall XR from 30 to 25 mg daily. Ok daily cialis 5 mg daily bc ED and prostate issues Daily 1/2 of 10 mg Cialis tablet daily Start sertraline 1/2 tablet daily and reduce duloxetine to 1 daily for 1 week, Then stop duloxetine and increase sertraline to 1 tablet daily  02/11/2022 appointment noted: Doing well overall.  More balanced.  Less lonely.  Met someone at church through a friend.  She's a Engineer, civil (consulting) and a [place] and they've done Bible studies. Switched from duloxetine to sertraline. Energy is good.  Depression generally managed.  Busy and  active.  No major anxiety except situationally.   Working at [employer].   Will play tennis. Has a roommate who plays tennis too. Still Christian men's Bible study.   M history leukemia and died at his 65 yo.  Grew up with anxiety over her dying.  He was the youngest.  09/24/22 appt noted: Still working [employer].  Heat at work bothering him and causing anxiety but ok with clonazepam.  Still satisfied with meds.   Hanging in there.  Using CBT.  Hopeful and thankful. 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  Not enough sleep at times DT work schedule.  No panic lately. Meds: clonazepam 0.5 mg QID, sertraline 50, vyvanse 70 SE not sig.   Doing F3 fitness class at 530 AM.  Still going to counseling.  Denies her alcohol or caffeine excess.  Past Psychiatric Medication Trials: Prozac, Zoloft 50, Lexapro fatigue.  Duloxetine 40, Wellbutrin 450, Buspar,  Concerta, Ritalin, Provigil, Vyvanse 70, modafinil,   Clonazepam 0.5 mg 4 tablets daily  Review of Systems:  Review of Systems  Constitutional:  Negative for fatigue.  Cardiovascular:  Negative for palpitations.  Genitourinary:  Negative for decreased urine volume.        ED  Musculoskeletal:  Positive for arthralgias and myalgias.  Neurological:  Negative for tremors.  Psychiatric/Behavioral:  Positive for decreased concentration. Negative for agitation, behavioral problems, confusion, dysphoric mood, hallucinations, self-injury, sleep disturbance and suicidal ideas. The patient is nervous/anxious. The patient is not hyperactive.     Medications: I have reviewed the patient's current medications.  Current Outpatient Medications  Medication Sig Dispense Refill   hydrochlorothiazide (HYDRODIURIL) 25 MG tablet Take 25 mg by mouth daily.     meloxicam (MOBIC) 15 MG tablet Take 15 mg by mouth daily.  0   metoprolol tartrate (LOPRESSOR) 25 MG tablet TAKE ONE TABLET BY MOUTH ONE TIME DAILY 90 tablet 0   sildenafil (VIAGRA) 100 MG tablet TAKE ONE TABLET BY MOUTH ONE TIME DAILY AS NEEDED FOR ERECTILE DYSFUNCTION 30 tablet 0   tadalafil (CIALIS) 5 MG tablet TAKE ONE TABLET BY MOUTH ONE TIME DAILY IN THE AFTERNOON 30 tablet 0   tamsulosin (FLOMAX) 0.4 MG CAPS capsule Take 0.4 mg by mouth daily.  0   testosterone cypionate (DEPOTESTOSTERONE CYPIONATE) 200 MG/ML injection INJECT INTO THE MUSCLE EVERY 14 DAYS     clonazePAM (KLONOPIN) 0.5 MG tablet Take 1 tablet (0.5 mg total) by mouth 4 (four) times daily. 120 tablet 5   lisdexamfetamine (VYVANSE) 70 MG capsule Take 1 capsule (70 mg total) by mouth daily. 30 capsule 0   [START ON 10/22/2022] lisdexamfetamine (VYVANSE) 70 MG capsule Take 1 capsule (70 mg total) by mouth daily. 30 capsule 0   [START ON 11/19/2022] lisdexamfetamine (VYVANSE) 70 MG capsule Take 1 capsule (70 mg total) by mouth daily. 30 capsule 0   sertraline (ZOLOFT) 50 MG tablet Take 1 tablet (50 mg total) by mouth daily. 90 tablet 1   No current facility-administered medications for this visit.    Medication Side Effects: ? ED related  Allergies: No Known Allergies  History reviewed. No pertinent past medical history.  History reviewed. No  pertinent family history.  Social History   Socioeconomic History   Marital status: Married    Spouse name: Not on file   Number of children: Not on file   Years of education: Not on file   Highest education level: Not on file  Occupational History   Not on file  Tobacco Use  Smoking status: Never   Smokeless tobacco: Never  Substance and Sexual Activity   Alcohol use: Never   Drug use: Never   Sexual activity: Not on file  Other Topics Concern   Not on file  Social History Narrative   Not on file   Social Determinants of Health   Financial Resource Strain: Not on file  Food Insecurity: Not on file  Transportation Needs: Not on file  Physical Activity: Not on file  Stress: Not on file  Social Connections: Not on file  Intimate Partner Violence: Not on file    Past Medical History, Surgical history, Social history, and Family history were reviewed and updated as appropriate.   Please see review of systems for further details on the patient's review from today.   Objective:   Physical Exam:  There were no vitals taken for this visit.  Physical Exam Constitutional:      General: He is not in acute distress. Musculoskeletal:        General: No deformity.  Neurological:     Mental Status: He is alert and oriented to person, place, and time.     Cranial Nerves: No dysarthria.     Coordination: Coordination normal.  Psychiatric:        Attention and Perception: Perception normal. He is inattentive. He does not perceive auditory or visual hallucinations.        Mood and Affect: Mood is anxious. Mood is not depressed. Affect is not labile, angry or tearful.        Speech: Speech normal.        Behavior: Behavior normal. Behavior is cooperative.        Thought Content: Thought content normal. Thought content is not paranoid or delusional. Thought content does not include homicidal or suicidal ideation. Thought content does not include suicidal plan.        Cognition  and Memory: Cognition and memory normal.        Judgment: Judgment normal. Judgment is not inappropriate.     Comments: Insight good  Some chronic stress and anxiety but better at present. Depression residual better.   Talkative and pleasant.     Lab Review:     Component Value Date/Time   NA 136 07/04/2017 1551   K 3.8 07/04/2017 1551   CL 101 07/04/2017 1551   CO2 23 07/04/2017 1551   GLUCOSE 108 (H) 07/04/2017 1551   BUN 20 07/04/2017 1551   CREATININE 1.11 07/04/2017 1551   CALCIUM 9.5 07/04/2017 1551   GFRNONAA >60 07/04/2017 1551   GFRAA >60 07/04/2017 1551       Component Value Date/Time   WBC 6.5 07/04/2017 1551   RBC 5.33 07/04/2017 1551   HGB 16.3 07/04/2017 1551   HCT 48.5 07/04/2017 1551   PLT 205 07/04/2017 1551   MCV 91.1 07/04/2017 1551   MCH 30.6 07/04/2017 1551   MCHC 33.6 07/04/2017 1551   RDW 14.2 07/04/2017 1551    No results found for: "POCLITH", "LITHIUM"   No results found for: "PHENYTOIN", "PHENOBARB", "VALPROATE", "CBMZ"   .res Assessment: Plan:   Wyatt Gallegos was seen today for follow-up, anxiety and add.  Diagnoses and all orders for this visit:  Panic disorder with agoraphobia -     sertraline (ZOLOFT) 50 MG tablet; Take 1 tablet (50 mg total) by mouth daily. -     clonazePAM (KLONOPIN) 0.5 MG tablet; Take 1 tablet (0.5 mg total) by mouth 4 (four) times daily.  Generalized anxiety disorder -  sertraline (ZOLOFT) 50 MG tablet; Take 1 tablet (50 mg total) by mouth daily. -     clonazePAM (KLONOPIN) 0.5 MG tablet; Take 1 tablet (0.5 mg total) by mouth 4 (four) times daily.  Attention deficit hyperactivity disorder (ADHD), predominantly inattentive type -     lisdexamfetamine (VYVANSE) 70 MG capsule; Take 1 capsule (70 mg total) by mouth daily. -     lisdexamfetamine (VYVANSE) 70 MG capsule; Take 1 capsule (70 mg total) by mouth daily. -     lisdexamfetamine (VYVANSE) 70 MG capsule; Take 1 capsule (70 mg total) by mouth  daily.  Depression, major, recurrent, in partial remission (HCC) -     sertraline (ZOLOFT) 50 MG tablet; Take 1 tablet (50 mg total) by mouth daily. -     clonazePAM (KLONOPIN) 0.5 MG tablet; Take 1 tablet (0.5 mg total) by mouth 4 (four) times daily.  Insomnia due to mental condition    30 min face to face time with patient was spent on counseling and coordination of care. We discussed we switched to sertraline and he's satisfied. Done well with switch to low-dose sertraline 50 mg daily from Cymbalta Option increase but sex SE limit.  He also has problems with irritability when he takes Adderall XR 30 mg but not when he takes Vyvanse.    However he still needs the clonazepam as noted 0.5 mg tablets 1 every morning and 2 nightly for sleep and anxiety.  He is not ideal that he is taking clonazepam in the morning along with Vyvanse but his anxiety has been debilitating at times and has interfered with his ability to function and work settings and maintain employment.  Have not been able to manage the anxiety by usual alternative preferred methods having failed multiple SSRIs.  Discussed potential benefits, risks, and side effects of stimulants with patient to include increased heart rate, palpitations, insomnia, increased anxiety, increased irritability, or decreased appetite.  Instructed patient to contact office if experiencing any significant tolerability issues. Still has sig ADD sx remaining and consider off label memantine.  We discussed the short-term risks associated with benzodiazepines including sedation and increased fall risk among others.  Discussed long-term side effect risk including dependence, potential withdrawal symptoms, and the potential eventual dose-related risk of dementia.   Disc risk dependence.  Not ideal to use Bz with stimulants but he's struggled with function without it.  Including affecting work performance DT anxiety and needs clonazepam.   Disc balancing benefit  stimulants vs BZ  Disc ED and tx and prostate.  Asks for RF Cialis.    Both duloxetine and potentially metoprolol could be contributing to some of his ED problems.    Disc chronic stress with $ and difficulty keeping a job ongoing.   Been at [employer] for awhile. Supportive therapy dealing with his loneliness and addressing self-destructive behavior out of it.   Supportive therapy dealing with work stress.  He's continued to have a lot of job instability which is a chronic stressor. Take advantage of faith resources.   Vyvanse 70, clonazepam 0.5 mg BID and 1.0 mg HS, Or Spread out clonazepam. If can't get Vyvanse DT shortage then reduce Adderall XR from 30 to 25 mg daily. Ok daily cialis 5 mg daily bc ED and prostate issues Daily 1/2 of 10 mg Cialis tablet daily continue sertraline 50 mg daily   FU 6 mos  Meredith Staggers, MD, DFAPA    Please see After Visit Summary for patient specific instructions.  No future  appointments.  No orders of the defined types were placed in this encounter.     -------------------------------

## 2022-11-05 ENCOUNTER — Other Ambulatory Visit: Payer: Self-pay | Admitting: Psychiatry

## 2022-11-05 DIAGNOSIS — N529 Male erectile dysfunction, unspecified: Secondary | ICD-10-CM

## 2022-11-05 NOTE — Telephone Encounter (Signed)
LF 09/5; LV 09/17- RTC 6 MONTHS

## 2023-01-07 ENCOUNTER — Telehealth: Payer: Self-pay | Admitting: Psychiatry

## 2023-01-07 ENCOUNTER — Other Ambulatory Visit: Payer: Self-pay

## 2023-01-07 DIAGNOSIS — F900 Attention-deficit hyperactivity disorder, predominantly inattentive type: Secondary | ICD-10-CM

## 2023-01-07 MED ORDER — LISDEXAMFETAMINE DIMESYLATE 70 MG PO CAPS: 70.0000 mg | ORAL_CAPSULE | Freq: Every day | ORAL | 0 refills | Status: DC

## 2023-01-07 NOTE — Telephone Encounter (Signed)
PENDED VYVNASE 70 MG TO [facility].

## 2023-01-07 NOTE — Telephone Encounter (Signed)
Patient called in for refill on Lisdexamfetamine 70mg . PH: [phone] Pharmacy [facility] [address] [place], [place]

## 2023-01-12 ENCOUNTER — Other Ambulatory Visit: Payer: Self-pay | Admitting: Psychiatry

## 2023-01-12 DIAGNOSIS — N529 Male erectile dysfunction, unspecified: Secondary | ICD-10-CM

## 2023-02-03 ENCOUNTER — Other Ambulatory Visit: Payer: Self-pay | Admitting: Psychiatry

## 2023-02-03 NOTE — Telephone Encounter (Signed)
Lf 12/31 lv 09/17 due back in March

## 2023-02-03 NOTE — Telephone Encounter (Signed)
Please schedule pt an appt. Due back in March . LV 09/17

## 2023-03-13 ENCOUNTER — Other Ambulatory Visit: Payer: Self-pay | Admitting: Psychiatry

## 2023-03-13 DIAGNOSIS — F3341 Major depressive disorder, recurrent, in partial remission: Secondary | ICD-10-CM

## 2023-03-13 DIAGNOSIS — F4001 Agoraphobia with panic disorder: Secondary | ICD-10-CM

## 2023-03-13 DIAGNOSIS — F411 Generalized anxiety disorder: Secondary | ICD-10-CM

## 2023-03-31 ENCOUNTER — Other Ambulatory Visit: Payer: Self-pay | Admitting: Psychiatry

## 2023-03-31 DIAGNOSIS — F3341 Major depressive disorder, recurrent, in partial remission: Secondary | ICD-10-CM

## 2023-03-31 DIAGNOSIS — F4001 Agoraphobia with panic disorder: Secondary | ICD-10-CM

## 2023-03-31 DIAGNOSIS — F411 Generalized anxiety disorder: Secondary | ICD-10-CM

## 2023-05-09 ENCOUNTER — Other Ambulatory Visit: Payer: Self-pay | Admitting: Psychiatry

## 2023-05-09 DIAGNOSIS — F411 Generalized anxiety disorder: Secondary | ICD-10-CM

## 2023-05-09 DIAGNOSIS — F3341 Major depressive disorder, recurrent, in partial remission: Secondary | ICD-10-CM

## 2023-05-09 DIAGNOSIS — F4001 Agoraphobia with panic disorder: Secondary | ICD-10-CM

## 2023-05-14 ENCOUNTER — Other Ambulatory Visit: Payer: Self-pay | Admitting: Psychiatry

## 2023-05-14 ENCOUNTER — Ambulatory Visit (INDEPENDENT_AMBULATORY_CARE_PROVIDER_SITE_OTHER): Payer: BC Managed Care – PPO | Admitting: Psychiatry

## 2023-05-14 DIAGNOSIS — Z91199 Patient's noncompliance with other medical treatment and regimen due to unspecified reason: Secondary | ICD-10-CM

## 2023-05-14 NOTE — Progress Notes (Signed)
 Pt has not kept appts.  Also not making payments.  Have DC vyvanse .  Will not RX it again until this px corrected.

## 2023-05-14 NOTE — Progress Notes (Signed)
 Not keeping appts.  Also not making any payments over longterm.  Have DC vyvanse .   Do not RS until pays for NS. Will renew clonazepam  bc at risk for sz if abruptly stops but he needs to respond with responsibilty.

## 2023-06-05 ENCOUNTER — Telehealth: Payer: Self-pay | Admitting: Psychiatry

## 2023-06-05 NOTE — Telephone Encounter (Signed)
 Pt called asking for the generic of vyvanse  70 mg. [facility]acy is [facility]  [address] [place] [address] [place]

## 2023-06-06 ENCOUNTER — Other Ambulatory Visit: Payer: Self-pay

## 2023-06-06 NOTE — Telephone Encounter (Signed)
 Pended Vyvanse  70 mg to [facility]

## 2023-06-08 ENCOUNTER — Other Ambulatory Visit: Payer: Self-pay | Admitting: Psychiatry

## 2023-06-08 DIAGNOSIS — F3341 Major depressive disorder, recurrent, in partial remission: Secondary | ICD-10-CM

## 2023-06-08 DIAGNOSIS — F4001 Agoraphobia with panic disorder: Secondary | ICD-10-CM

## 2023-06-08 DIAGNOSIS — F411 Generalized anxiety disorder: Secondary | ICD-10-CM

## 2023-07-09 ENCOUNTER — Other Ambulatory Visit: Payer: Self-pay | Admitting: Psychiatry

## 2023-07-09 DIAGNOSIS — F4001 Agoraphobia with panic disorder: Secondary | ICD-10-CM

## 2023-07-09 DIAGNOSIS — F3341 Major depressive disorder, recurrent, in partial remission: Secondary | ICD-10-CM

## 2023-07-09 DIAGNOSIS — F411 Generalized anxiety disorder: Secondary | ICD-10-CM

## 2023-07-27 ENCOUNTER — Other Ambulatory Visit: Payer: Self-pay | Admitting: Psychiatry

## 2023-07-27 DIAGNOSIS — F3341 Major depressive disorder, recurrent, in partial remission: Secondary | ICD-10-CM

## 2023-07-27 DIAGNOSIS — F4001 Agoraphobia with panic disorder: Secondary | ICD-10-CM

## 2023-07-27 DIAGNOSIS — F411 Generalized anxiety disorder: Secondary | ICD-10-CM

## 2023-07-29 ENCOUNTER — Ambulatory Visit: Admitting: Psychiatry

## 2023-08-05 ENCOUNTER — Ambulatory Visit: Admitting: Psychiatry

## 2023-08-06 ENCOUNTER — Other Ambulatory Visit: Payer: Self-pay | Admitting: Psychiatry

## 2023-08-06 DIAGNOSIS — F411 Generalized anxiety disorder: Secondary | ICD-10-CM

## 2023-08-06 DIAGNOSIS — F4001 Agoraphobia with panic disorder: Secondary | ICD-10-CM

## 2023-08-06 DIAGNOSIS — F3341 Major depressive disorder, recurrent, in partial remission: Secondary | ICD-10-CM

## 2023-09-07 ENCOUNTER — Other Ambulatory Visit: Payer: Self-pay | Admitting: Psychiatry

## 2023-09-07 DIAGNOSIS — F411 Generalized anxiety disorder: Secondary | ICD-10-CM

## 2023-09-07 DIAGNOSIS — F3341 Major depressive disorder, recurrent, in partial remission: Secondary | ICD-10-CM

## 2023-09-07 DIAGNOSIS — F4001 Agoraphobia with panic disorder: Secondary | ICD-10-CM
